# Patient Record
Sex: Female | Born: 1940
Health system: Southern US, Community
[De-identification: ages and names within clinical notes are randomized; demographics above are authoritative.]

## PROBLEM LIST (undated history)

## (undated) DIAGNOSIS — F419 Anxiety disorder, unspecified: Secondary | ICD-10-CM

## (undated) DIAGNOSIS — F32A Depression, unspecified: Secondary | ICD-10-CM

## (undated) DIAGNOSIS — K219 Gastro-esophageal reflux disease without esophagitis: Secondary | ICD-10-CM

## (undated) DIAGNOSIS — Z86010 Personal history of colonic polyps: Secondary | ICD-10-CM

## (undated) DIAGNOSIS — T7840XA Allergy, unspecified, initial encounter: Secondary | ICD-10-CM

## (undated) DIAGNOSIS — D649 Anemia, unspecified: Secondary | ICD-10-CM

## (undated) DIAGNOSIS — K2990 Gastroduodenitis, unspecified, without bleeding: Secondary | ICD-10-CM

## (undated) DIAGNOSIS — IMO0002 Reserved for concepts with insufficient information to code with codable children: Secondary | ICD-10-CM

## (undated) DIAGNOSIS — K589 Irritable bowel syndrome without diarrhea: Secondary | ICD-10-CM

## (undated) DIAGNOSIS — F329 Major depressive disorder, single episode, unspecified: Secondary | ICD-10-CM

## (undated) DIAGNOSIS — K2970 Gastritis, unspecified, without bleeding: Secondary | ICD-10-CM

## (undated) HISTORY — PX: TONSILLECTOMY: SUR1361

## (undated) HISTORY — DX: Anemia, unspecified: D64.9

## (undated) HISTORY — DX: Allergy, unspecified, initial encounter: T78.40XA

## (undated) HISTORY — DX: Gastroduodenitis, unspecified, without bleeding: K29.90

## (undated) HISTORY — DX: Major depressive disorder, single episode, unspecified: F32.9

## (undated) HISTORY — PX: CHOLECYSTECTOMY: SHX55

## (undated) HISTORY — PX: APPENDECTOMY: SHX54

## (undated) HISTORY — DX: Gastritis, unspecified, without bleeding: K29.70

## (undated) HISTORY — DX: Gastro-esophageal reflux disease without esophagitis: K21.9

## (undated) HISTORY — DX: Anxiety disorder, unspecified: F41.9

## (undated) HISTORY — DX: Irritable bowel syndrome, unspecified: K58.9

## (undated) HISTORY — PX: ABDOMINAL HYSTERECTOMY: SUR658

## (undated) HISTORY — DX: Depression, unspecified: F32.A

## (undated) HISTORY — DX: Reserved for concepts with insufficient information to code with codable children: IMO0002

## (undated) HISTORY — DX: Personal history of colonic polyps: Z86.010

---

## 2000-01-23 ENCOUNTER — Encounter: Payer: Self-pay | Admitting: Internal Medicine

## 2000-01-23 ENCOUNTER — Ambulatory Visit (HOSPITAL_COMMUNITY): Admission: RE | Admit: 2000-01-23 | Discharge: 2000-01-23 | Payer: Self-pay | Admitting: Internal Medicine

## 2001-01-15 ENCOUNTER — Ambulatory Visit (HOSPITAL_COMMUNITY): Admission: RE | Admit: 2001-01-15 | Discharge: 2001-01-15 | Payer: Self-pay | Admitting: Obstetrics and Gynecology

## 2001-01-15 ENCOUNTER — Encounter: Payer: Self-pay | Admitting: Obstetrics and Gynecology

## 2002-01-21 ENCOUNTER — Ambulatory Visit (HOSPITAL_COMMUNITY): Admission: RE | Admit: 2002-01-21 | Discharge: 2002-01-21 | Payer: Self-pay | Admitting: Obstetrics and Gynecology

## 2002-01-21 ENCOUNTER — Encounter: Payer: Self-pay | Admitting: Obstetrics and Gynecology

## 2002-01-21 ENCOUNTER — Other Ambulatory Visit: Admission: RE | Admit: 2002-01-21 | Discharge: 2002-01-21 | Payer: Self-pay | Admitting: Obstetrics and Gynecology

## 2003-03-31 ENCOUNTER — Encounter: Payer: Self-pay | Admitting: Family Medicine

## 2003-03-31 ENCOUNTER — Ambulatory Visit (HOSPITAL_COMMUNITY): Admission: RE | Admit: 2003-03-31 | Discharge: 2003-03-31 | Payer: Self-pay | Admitting: Obstetrics and Gynecology

## 2004-03-31 ENCOUNTER — Ambulatory Visit (HOSPITAL_COMMUNITY): Admission: RE | Admit: 2004-03-31 | Discharge: 2004-03-31 | Payer: Self-pay | Admitting: Family Medicine

## 2005-02-26 ENCOUNTER — Encounter: Admission: RE | Admit: 2005-02-26 | Discharge: 2005-02-26 | Payer: Self-pay | Admitting: Family Medicine

## 2006-03-29 ENCOUNTER — Encounter: Admission: RE | Admit: 2006-03-29 | Discharge: 2006-03-29 | Payer: Self-pay | Admitting: Pediatrics

## 2007-04-17 ENCOUNTER — Ambulatory Visit (HOSPITAL_COMMUNITY): Admission: RE | Admit: 2007-04-17 | Discharge: 2007-04-17 | Payer: Self-pay | Admitting: Family Medicine

## 2007-06-16 ENCOUNTER — Emergency Department (HOSPITAL_COMMUNITY): Admission: EM | Admit: 2007-06-16 | Discharge: 2007-06-16 | Payer: Self-pay | Admitting: Emergency Medicine

## 2007-09-04 DIAGNOSIS — Z860100 Personal history of colon polyps, unspecified: Secondary | ICD-10-CM

## 2007-09-04 HISTORY — DX: Personal history of colonic polyps: Z86.010

## 2007-09-04 HISTORY — DX: Personal history of colon polyps, unspecified: Z86.0100

## 2007-10-14 ENCOUNTER — Ambulatory Visit: Payer: Self-pay | Admitting: Gastroenterology

## 2007-10-14 LAB — CONVERTED CEMR LAB
ALT: 22 units/L (ref 0–35)
AST: 18 units/L (ref 0–37)
CO2: 30 meq/L (ref 19–32)
Calcium: 10.1 mg/dL (ref 8.4–10.5)
Creatinine, Ser: 1 mg/dL (ref 0.4–1.2)
Eosinophils Absolute: 0.2 10*3/uL (ref 0.0–0.6)
Eosinophils Relative: 1.8 % (ref 0.0–5.0)
Ferritin: 100.7 ng/mL (ref 10.0–291.0)
GFR calc Af Amer: 71 mL/min
Iron: 75 ug/dL (ref 42–145)
Monocytes Absolute: 1 10*3/uL — ABNORMAL HIGH (ref 0.2–0.7)
Neutro Abs: 7.8 10*3/uL — ABNORMAL HIGH (ref 1.4–7.7)
Neutrophils Relative %: 66.6 % (ref 43.0–77.0)
RDW: 13.6 % (ref 11.5–14.6)
Sodium: 137 meq/L (ref 135–145)
TSH: 0.95 microintl units/mL (ref 0.35–5.50)
Total Protein: 7.4 g/dL (ref 6.0–8.3)
Transferrin: 237 mg/dL (ref >=212.0)

## 2007-11-21 ENCOUNTER — Ambulatory Visit: Payer: Self-pay | Admitting: Gastroenterology

## 2007-11-21 ENCOUNTER — Encounter: Payer: Self-pay | Admitting: Gastroenterology

## 2007-11-21 DIAGNOSIS — K2990 Gastroduodenitis, unspecified, without bleeding: Secondary | ICD-10-CM

## 2007-11-21 DIAGNOSIS — K2970 Gastritis, unspecified, without bleeding: Secondary | ICD-10-CM | POA: Insufficient documentation

## 2007-12-26 ENCOUNTER — Ambulatory Visit: Payer: Self-pay | Admitting: Gastroenterology

## 2007-12-26 DIAGNOSIS — R1012 Left upper quadrant pain: Secondary | ICD-10-CM | POA: Insufficient documentation

## 2007-12-29 ENCOUNTER — Telehealth: Payer: Self-pay | Admitting: Gastroenterology

## 2008-01-05 ENCOUNTER — Telehealth: Payer: Self-pay | Admitting: Gastroenterology

## 2008-01-20 DIAGNOSIS — T7840XA Allergy, unspecified, initial encounter: Secondary | ICD-10-CM | POA: Insufficient documentation

## 2008-01-20 DIAGNOSIS — F329 Major depressive disorder, single episode, unspecified: Secondary | ICD-10-CM | POA: Insufficient documentation

## 2008-01-20 DIAGNOSIS — F4323 Adjustment disorder with mixed anxiety and depressed mood: Secondary | ICD-10-CM | POA: Insufficient documentation

## 2008-01-22 ENCOUNTER — Ambulatory Visit: Payer: Self-pay | Admitting: Gastroenterology

## 2008-01-22 DIAGNOSIS — K589 Irritable bowel syndrome without diarrhea: Secondary | ICD-10-CM | POA: Insufficient documentation

## 2008-01-22 DIAGNOSIS — B373 Candidiasis of vulva and vagina: Secondary | ICD-10-CM | POA: Insufficient documentation

## 2008-01-22 DIAGNOSIS — K219 Gastro-esophageal reflux disease without esophagitis: Secondary | ICD-10-CM | POA: Insufficient documentation

## 2008-01-27 ENCOUNTER — Telehealth: Payer: Self-pay | Admitting: Gastroenterology

## 2008-02-06 ENCOUNTER — Encounter (INDEPENDENT_AMBULATORY_CARE_PROVIDER_SITE_OTHER): Payer: Self-pay | Admitting: *Deleted

## 2008-02-24 ENCOUNTER — Ambulatory Visit: Payer: Self-pay | Admitting: Gastroenterology

## 2008-02-24 LAB — CONVERTED CEMR LAB
Fecal Occult Blood: NEGATIVE
OCCULT 1: NEGATIVE
OCCULT 5: NEGATIVE

## 2008-04-13 ENCOUNTER — Encounter: Payer: Self-pay | Admitting: Gastroenterology

## 2008-04-13 ENCOUNTER — Telehealth: Payer: Self-pay | Admitting: Gastroenterology

## 2008-04-23 ENCOUNTER — Ambulatory Visit: Payer: Self-pay | Admitting: Gastroenterology

## 2008-04-23 LAB — CONVERTED CEMR LAB
Basophils Absolute: 0.1 10*3/uL (ref 0.0–0.1)
Monocytes Absolute: 0.7 10*3/uL (ref 0.1–1.0)
Platelets: 377 10*3/uL (ref 150–400)
RBC: 4.55 M/uL (ref 3.87–5.11)
RDW: 13.1 % (ref 11.5–14.6)
Sed Rate: 10 mm/hr (ref 0–22)

## 2008-04-26 ENCOUNTER — Telehealth (INDEPENDENT_AMBULATORY_CARE_PROVIDER_SITE_OTHER): Payer: Self-pay

## 2008-04-26 ENCOUNTER — Telehealth: Payer: Self-pay | Admitting: Gastroenterology

## 2008-05-31 ENCOUNTER — Ambulatory Visit: Payer: Self-pay | Admitting: Gastroenterology

## 2008-06-03 ENCOUNTER — Encounter: Payer: Self-pay | Admitting: Gastroenterology

## 2008-06-07 ENCOUNTER — Telehealth: Payer: Self-pay | Admitting: Gastroenterology

## 2009-08-09 ENCOUNTER — Ambulatory Visit (HOSPITAL_COMMUNITY): Admission: RE | Admit: 2009-08-09 | Discharge: 2009-08-09 | Payer: Self-pay | Admitting: Obstetrics and Gynecology

## 2009-10-25 DIAGNOSIS — E663 Overweight: Secondary | ICD-10-CM | POA: Insufficient documentation

## 2009-11-02 DIAGNOSIS — B353 Tinea pedis: Secondary | ICD-10-CM | POA: Insufficient documentation

## 2009-12-26 DIAGNOSIS — J342 Deviated nasal septum: Secondary | ICD-10-CM | POA: Insufficient documentation

## 2010-09-24 ENCOUNTER — Encounter: Payer: Self-pay | Admitting: Family Medicine

## 2010-12-22 DIAGNOSIS — J309 Allergic rhinitis, unspecified: Secondary | ICD-10-CM | POA: Insufficient documentation

## 2011-01-16 NOTE — Assessment & Plan Note (Signed)
[facility]                         GASTROENTEROLOGY OFFICE NOTE   SINDHU, NGUYEN                        MRN:          [id number]  DATE:10/14/2007                            DOB:          06-Jun-1941    Stephanie Valenzuela is a 70 year old white female, self-referred today, for  evaluation of abdominal pain in the left upper quadrant and epigastric  area for the last ten days with some radiation of pain into her mid-  back.  This is more of a dyspeptic indigestion type pain, but she also  is having some lower abdominal gas, bloating, cramping, recurrent left  lower quadrant discomfort.  She has frequent bowel movements with gas  and bloating, but denies melena or hematochezia.  She tried some over-  the-counter Zantac with mild improvement in her reflux symptoms.  There  is no associated dysphagia or hepatobiliary complaints.  She  specifically denies clay-colored stools, dark urine, icterus, fever or  chills.  Her appetite is good and her weight is stable.   Patient had a cholecystectomy in the 1970s and had ERCP, what sounds  like a retained common bile duct stone in 1982.  These records are not  available for review.  She also has a history of previous depression,  allergic sinusitis.   She has been seen in the past by Dr. Corinda Gubler and had colonoscopy with  polypectomy in 2001.  Diverticulosis was also noted at that time.  Followup colonoscopy was with Dr. Tasia Catchings in 2005 and this was  apparently normal.  Again, I do not have this record for review.   She follows a fairly regular diet, denies any specific food intolerance.  Her appetite is good and she has had no weight-loss.  She denies the  abuse of alcohol, cigarettes or NSAIDs.   MEDICATIONS:  None.   She has a history of PENICILLIN and SULFA allergy.   FAMILY HISTORY:  Noncontributory.   SOCIAL HISTORY:  She is divorced and lives by herself.  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  She quit  smoking a few months ago, has never abused ethanol.   REVIEW OF SYSTEMS:  Remarkable for minor problems, such as night sweats,  back pain, headaches, insomnia, nonspecific peripheral edema.  She  denies any specific cardiopulmonary, genitourinary, neurologic,  orthopedic, endocrine or gynecologic problems.  She is status post  hysterectomy.   EXAM:  She is a healthy-appearing, white female, appearing younger than  her stated age.  She is 5 feet 6 and weighs 169 pounds.  Blood pressure is 100/64, and pulse was 80 and regular.  I could not appreciate stigmata of chronic liver disease.  CHEST:  Clear and she was in a regular rhythm without murmurs, gallops  or rubs.  I could not appreciate hepatosplenomegaly, abdominal masses or  significant tenderness.  There is a well-healed epigastric abdominal  scar.  Bowel sounds were normal.  Peripheral extremities were unremarkable.  MENTAL STATUS:  Clear.  Inspection of rectum was unremarkable, as was rectal exam.  There was  soft stool present that  was trace guaiac-positive.   ASSESSMENT:  1. Rather classic gastroesophageal reflux disease in a patient who is      status post cholecystectomy.  2. Irritable bowel syndrome in a patient with diverticulosis coli.  3. Guaiac-positive stool of unexplained etiology - rule out recurrent      colon polyps.  4. Status post cholecystectomy with removal of common bile duct stone.  5. History of chronic cigarette abuse over many years.   RECOMMENDATIONS:  1. Outpatient endoscopy and colonoscopy at her convenience.  2. Check screening laboratory parameters.  3. Reflux regimen explained to patient.  Will start Aciphex 20 mg      before breakfast and twice a day as needed.  4. High-fiber diet as tolerated.     Vania Rea. Jarold Motto, MD, Caleen Essex, FAGA  Electronically Signed    DRP/MedQ  DD: 10/14/2007  DT: 10/15/2007  Job #: [id number]   cc:   Arelia Sneddon Vincente Poli, M.D.

## 2011-06-14 LAB — CBC
HCT: 41.3
Hemoglobin: 14.4
MCHC: 34.9
MCV: 88.1
RBC: 4.68
RDW: 12.4
WBC: 13.9 — ABNORMAL HIGH

## 2011-06-14 LAB — BASIC METABOLIC PANEL
CO2: 24
Calcium: 8.9
Creatinine, Ser: 1.24 — ABNORMAL HIGH
GFR calc Af Amer: 52 — ABNORMAL LOW
GFR calc non Af Amer: 43 — ABNORMAL LOW
Glucose, Bld: 116 — ABNORMAL HIGH
Sodium: 132 — ABNORMAL LOW

## 2011-06-14 LAB — URINALYSIS, ROUTINE W REFLEX MICROSCOPIC
Ketones, ur: 15 — AB
Urobilinogen, UA: 1

## 2011-06-14 LAB — DIFFERENTIAL
Eosinophils Absolute: 0
Eosinophils Relative: 0
Monocytes Absolute: 1.6 — ABNORMAL HIGH
Neutrophils Relative %: 74

## 2011-06-14 LAB — URINE CULTURE

## 2011-08-22 DIAGNOSIS — R238 Other skin changes: Secondary | ICD-10-CM | POA: Insufficient documentation

## 2011-08-22 DIAGNOSIS — N390 Urinary tract infection, site not specified: Secondary | ICD-10-CM | POA: Insufficient documentation

## 2011-09-05 ENCOUNTER — Telehealth: Payer: Self-pay | Admitting: Gastroenterology

## 2011-09-05 NOTE — Telephone Encounter (Signed)
Pt with hx of bleeding ulcers. Last OV 04/23/2008 for recurrent H. Pylori; repeat Breath Test was neg. Hx Depression, gastritis, Abd. Pain, cholecystectomy in 1970. Pt reports a lot of pain like before when she had an ulcer. The pain is in her left rib area that radiates to her back. She went to her PCP with no help. She takes no meds, her stools are regular, no blood in her stools, but she is running a low grade temp. Pt was given an appt on 09/11/11 at 2pm.

## 2011-09-07 ENCOUNTER — Encounter: Payer: Self-pay | Admitting: *Deleted

## 2011-09-11 ENCOUNTER — Other Ambulatory Visit (INDEPENDENT_AMBULATORY_CARE_PROVIDER_SITE_OTHER): Payer: Medicare Other

## 2011-09-11 ENCOUNTER — Encounter: Payer: Self-pay | Admitting: Gastroenterology

## 2011-09-11 ENCOUNTER — Ambulatory Visit (INDEPENDENT_AMBULATORY_CARE_PROVIDER_SITE_OTHER): Payer: Medicare Other | Admitting: Gastroenterology

## 2011-09-11 DIAGNOSIS — R1084 Generalized abdominal pain: Secondary | ICD-10-CM

## 2011-09-11 DIAGNOSIS — Z9049 Acquired absence of other specified parts of digestive tract: Secondary | ICD-10-CM

## 2011-09-11 DIAGNOSIS — R109 Unspecified abdominal pain: Secondary | ICD-10-CM | POA: Insufficient documentation

## 2011-09-11 DIAGNOSIS — Z79899 Other long term (current) drug therapy: Secondary | ICD-10-CM

## 2011-09-11 DIAGNOSIS — Z9089 Acquired absence of other organs: Secondary | ICD-10-CM

## 2011-09-11 LAB — CBC WITH DIFFERENTIAL/PLATELET
Basophils Absolute: 0.1 10*3/uL (ref 0.0–0.1)
Basophils Relative: 1.2 % (ref 0.0–3.0)
Eosinophils Absolute: 0.2 10*3/uL (ref 0.0–0.7)
Eosinophils Relative: 2.2 % (ref 0.0–5.0)
HCT: 44.8 % (ref 36.0–46.0)
Lymphocytes Relative: 28.2 % (ref 12.0–46.0)
MCV: 90.9 fl (ref 78.0–100.0)
Monocytes Absolute: 0.8 10*3/uL (ref 0.1–1.0)
Monocytes Relative: 8.7 % (ref 3.0–12.0)
WBC: 9.3 10*3/uL (ref 4.5–10.5)

## 2011-09-11 LAB — BASIC METABOLIC PANEL
BUN: 14 mg/dL (ref 6–23)
CO2: 30 mEq/L (ref 19–32)
GFR: 63.26 mL/min (ref >60.00)

## 2011-09-11 LAB — HEPATIC FUNCTION PANEL
ALT: 22 U/L (ref 0–35)
AST: 18 U/L (ref 0–37)
Albumin: 4.1 g/dL (ref 3.5–5.2)
Alkaline Phosphatase: 110 U/L (ref 39–117)
Total Bilirubin: 0.8 mg/dL (ref 0.3–1.2)
Total Protein: 7.5 g/dL (ref 6.0–8.3)

## 2011-09-11 LAB — IBC PANEL: Saturation Ratios: 37.5 % (ref 20.0–50.0)

## 2011-09-11 LAB — SEDIMENTATION RATE: Sed Rate: 13 mm/hr (ref 0–22)

## 2011-09-11 LAB — VITAMIN B12: Vitamin B-12: 355 pg/mL (ref 211–911)

## 2011-09-11 LAB — FOLATE: Folate: 21.8 ng/mL (ref >5.9)

## 2011-09-11 LAB — TSH: TSH: 0.76 u[IU]/mL (ref 0.35–5.50)

## 2011-09-11 NOTE — Progress Notes (Signed)
History of Present Illness:  This is a 71 year old Caucasian female status post cholecystectomy who has had years of recurrent epigastric and left lower quadrant abdominal pain of unexplained etiology. In 2009 she had a negative colonoscopy, and endoscopy showed evidence of H. pylori gastritis. She was treated with triple drug therapy, and had subsequent hydrogen breath testing which confirmed H. pylori eradication. Currently she describes a dull aching sensation in her epigastric and left upper quadrant area radiating into her back and into her left lower quadrant. This has not been aided by Prilosec therapy, and has no relationship to meals or bowel movements. It is a dull aching pain that does not keep her awake at night, and there are no associated hepatobiliary or systemic complaints. She denies abuse of alcohol, cigarettes, or NSAIDs. Also there is no history of specific food intolerances. She does have mild nausea and occasional acid reflux symptomatology, but no dysphagia.  I have reviewed this patient's present history, medical and surgical past history, allergies and medications.     ROS: The remainder of the 10 point ROS is negative.. positive for chronic back pain, allergic sinusitis, chronic cough and fatigue, chronic depression, headaches, night sweats, and recurrent sore throats. She also has had previous hysterectomy.     Physical Exam: General well developed well nourished patient in no acute distress, appearing her stated age Eyes PERRLA, no icterus, fundoscopic exam per opthamologist Skin no lesions noted Neck supple, no adenopathy, no thyroid enlargement, no tenderness Chest clear to percussion and auscultation Heart no significant murmurs, gallops or rubs noted Abdomen no hepatosplenomegaly masses or tenderness, BS normal.  Rectal inspection normal no fissures, or fistulae noted.  No masses or tenderness on digital exam. Stool guaiac negative. Noted tenderness to deep  palpation the left lower quadrant without rebound. Extremities no acute joint lesions, edema, phlebitis or evidence of cellulitis. Neurologic patient oriented x 3, cranial nerves intact, no focal neurologic deficits noted. Psychological mental status normal and normal affect.  Assessment and plan: Unusual abdominal pain which seems to involve her entire left abdominal area. This been present for over 4 years without a definitive diagnosis. I have drawn screening laboratory parameters and we'll proceed with CT scan of the abdomen and pelvis. She may need followup endoscopy and colonoscopy depending on these results. I have asked her to avoid NSAIDs and to use when necessary Tylenol for pain.  Encounter Diagnosis  Name Primary?  . Abdominal pain Yes

## 2011-09-11 NOTE — Patient Instructions (Signed)
Please go to the basement today for your labs.    You have been scheduled for a CT scan of the abdomen and pelvis at [facility] ([address]---this is in the same building as Architectural technologist).   You are scheduled on 09/14/2011 at 9:00am. You should arrive 15 minutes prior to your appointment time for registration. Please follow the written instructions below on the day of your exam:  WARNING: IF YOU ARE ALLERGIC TO IODINE/X-RAY DYE, PLEASE NOTIFY RADIOLOGY IMMEDIATELY AT [phone]! YOU WILL BE GIVEN A 13 HOUR PREMEDICATION PREP.  1) Do not eat or drink anything after 5:00am (4 hours prior to your test) 2) You have been given 2 bottles of oral contrast to drink. The solution may taste better if refrigerated, but do NOT add ice or any other liquid to this solution. Shake  well before drinking.    Drink 1 bottle of contrast @ 7:00am (2 hours prior to your exam)  Drink 1 bottle of contrast @ 8:00am (1 hour prior to your exam)  You may take any medications as prescribed with a small amount of water except for the following: Metformin, Glucophage, Glucovance, Avandamet, Riomet, Fortamet, Actoplus Met, Janumet, Glumetza or Metaglip. The above medications must be held the day of the exam AND 48 hours after the exam.  The purpose of you drinking the oral contrast is to aid in the visualization of your intestinal tract. The contrast solution may cause some diarrhea. Before your exam is started, you will be given a small amount of fluid to drink. Depending on your individual set of symptoms, you may also receive an intravenous injection of x-ray contrast/dye. Plan on being at [facility] for 30 minutes or long, depending on the type of exam you are having performed.  If you have any questions regarding your exam or if you need to reschedule, you may call the CT department at [phone] between the hours of 8:00 am and 5:00 pm,  Monday-Friday.  ________________________________________________________________________

## 2011-09-14 ENCOUNTER — Telehealth: Payer: Self-pay | Admitting: *Deleted

## 2011-09-14 ENCOUNTER — Ambulatory Visit (INDEPENDENT_AMBULATORY_CARE_PROVIDER_SITE_OTHER)
Admission: RE | Admit: 2011-09-14 | Discharge: 2011-09-14 | Disposition: A | Discharge status: Home / Self Care | Payer: Medicare Other | Source: Ambulatory Visit | Attending: Gastroenterology | Admitting: Gastroenterology

## 2011-09-14 ENCOUNTER — Other Ambulatory Visit: Payer: Self-pay | Admitting: Gastroenterology

## 2011-09-14 DIAGNOSIS — R109 Unspecified abdominal pain: Secondary | ICD-10-CM

## 2011-09-14 DIAGNOSIS — R110 Nausea: Secondary | ICD-10-CM

## 2011-09-14 DIAGNOSIS — IMO0001 Reserved for inherently not codable concepts without codable children: Secondary | ICD-10-CM

## 2011-09-14 IMAGING — CT CT ABD-PELV W/ CM
2 of 5 series · 17 of 46 positions shown, 19 images · IV contrast (omnipaque)
Comparison: CT scan [DATE].

CLINICAL DATA: Left-sided abdominal pain and nausea.

CT ABDOMEN AND PELVIS WITH CONTRAST
TECHNIQUE: Multidetector CT imaging of the abdomen and pelvis was
performed following the standard protocol during bolus
administration of intravenous contrast.
Contrast: 100mL OMNIPAQUE IOHEXOL 300 MG/ML IV SOLN

[Series 2: abd/ pel 5mm · axial · 0.70mm/px · z∈[-449,-24]mm · 14 of 95 slices shown, 16 images]
[im 5/95  soft-tissue]
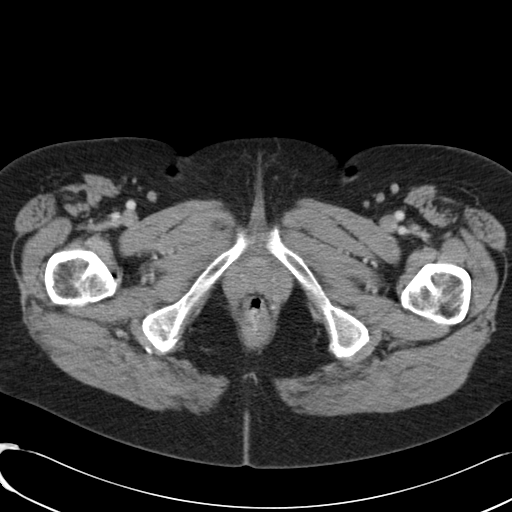
[im 5/95  bone]
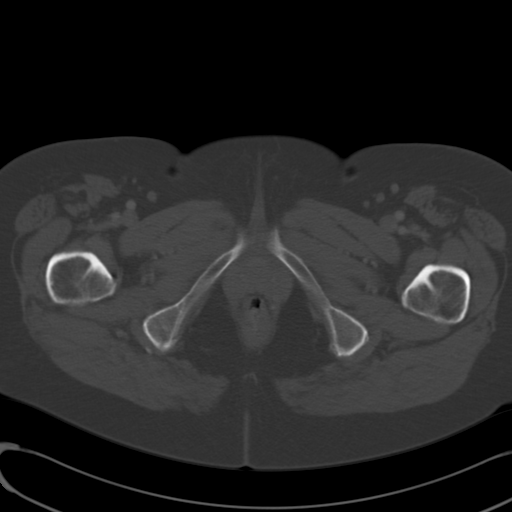
[im 15/95  soft-tissue]
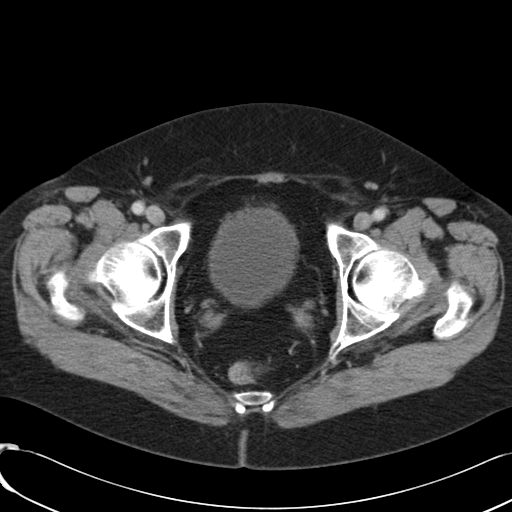
[im 19/95  soft-tissue]
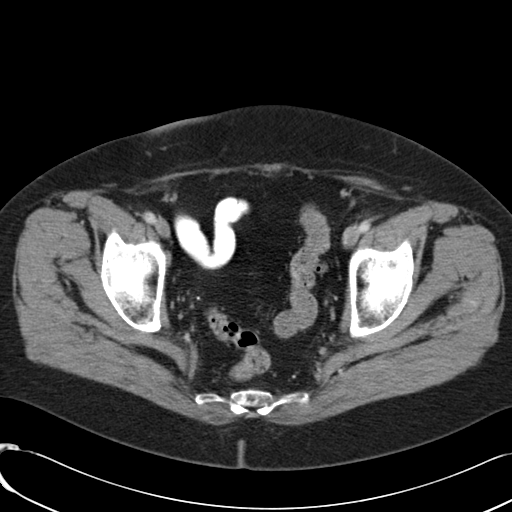
[im 24/95  soft-tissue]
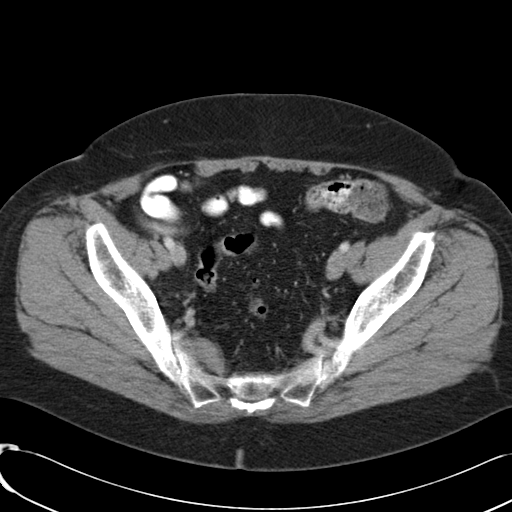
[im 33/95  soft-tissue]
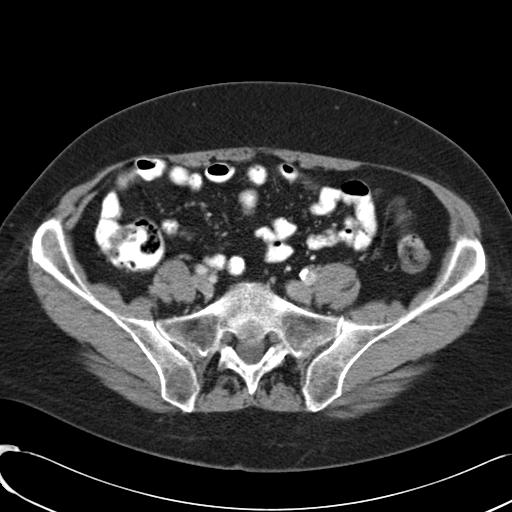
[im 38/95  soft-tissue]
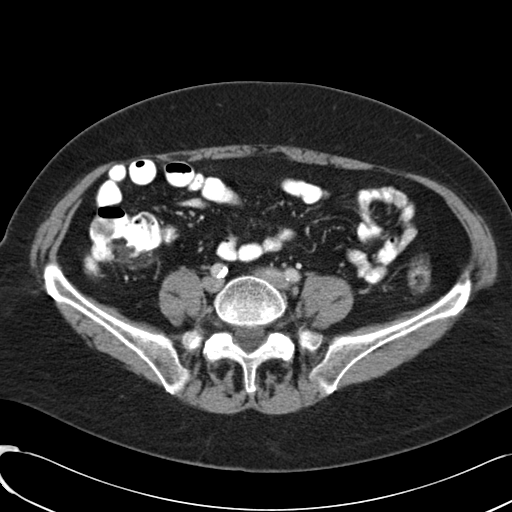
[im 43/95  soft-tissue]
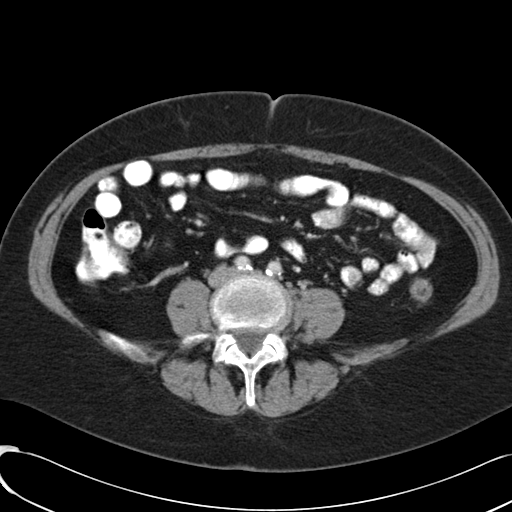
[im 52/95  soft-tissue]
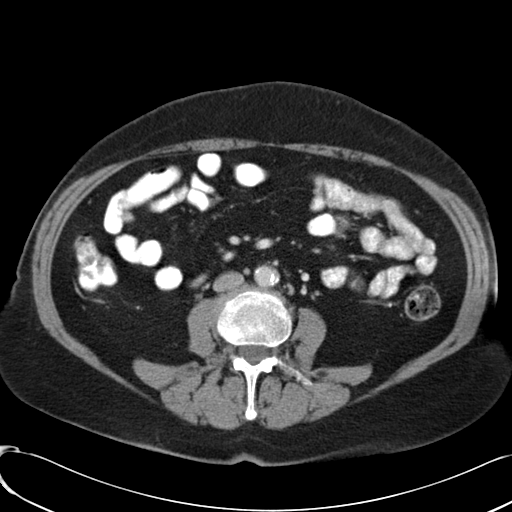
[im 57/95  soft-tissue]
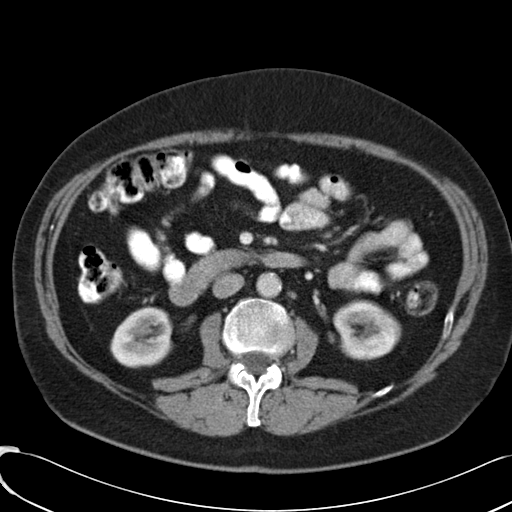
[im 57/95  bone]
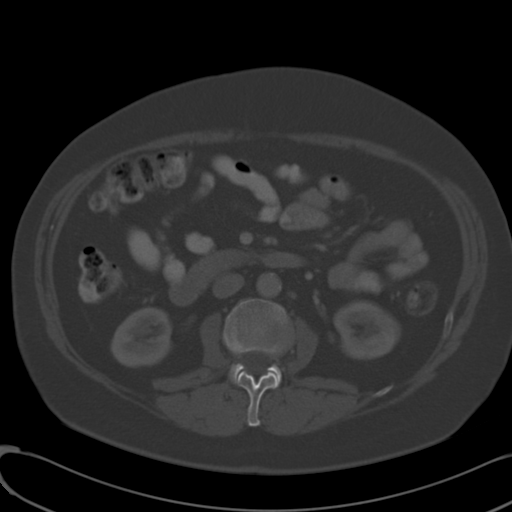
[im 62/95  soft-tissue]
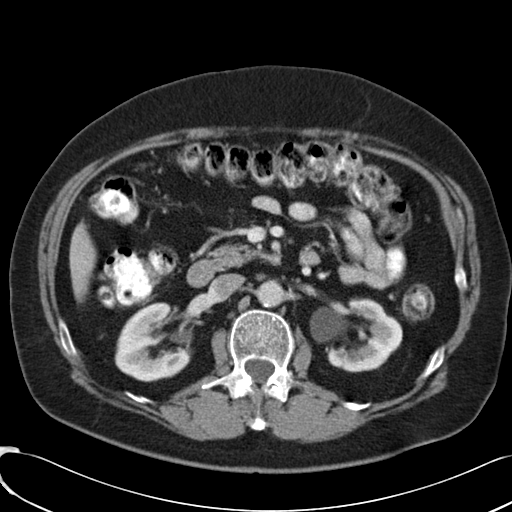
[im 71/95  soft-tissue]
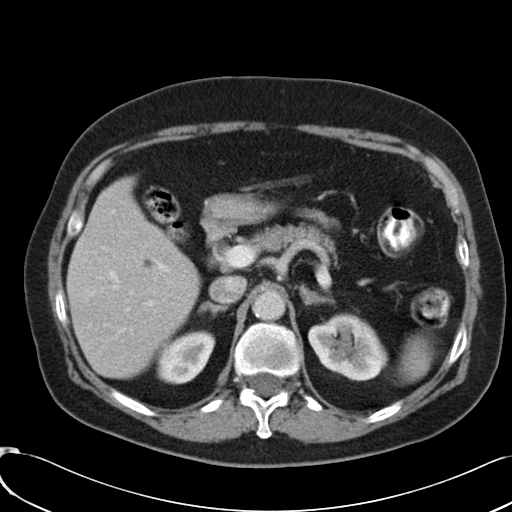
[im 76/95  soft-tissue]
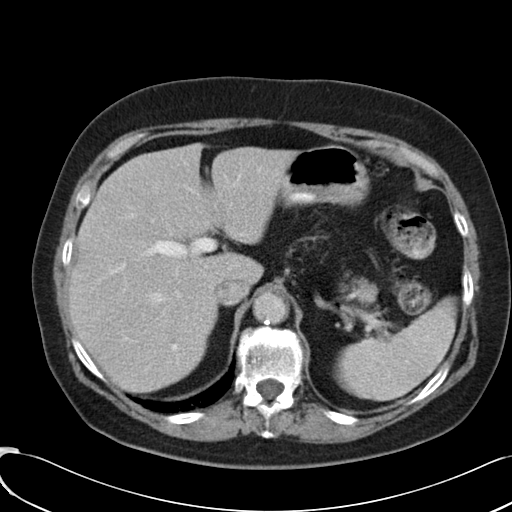
[im 80/95  soft-tissue]
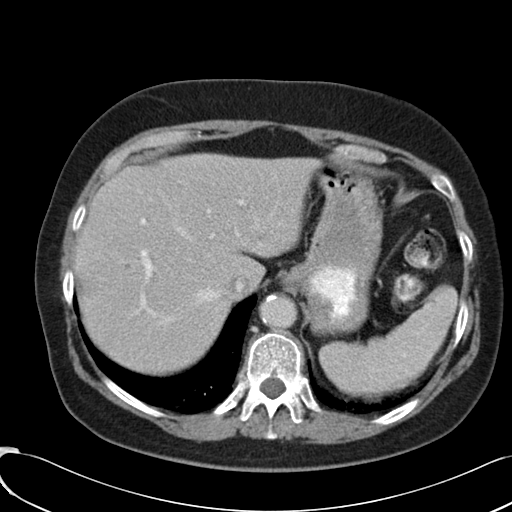
[im 90/95  soft-tissue]
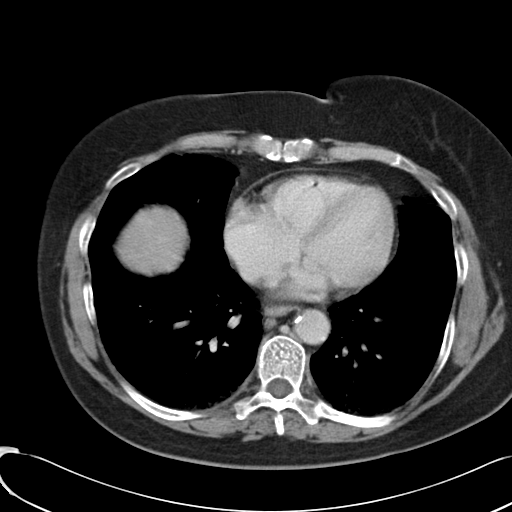

[Series 602: cor · coronal · 0.95mm/px · 3 of 102 slices shown]
[im 34/102  soft-tissue]
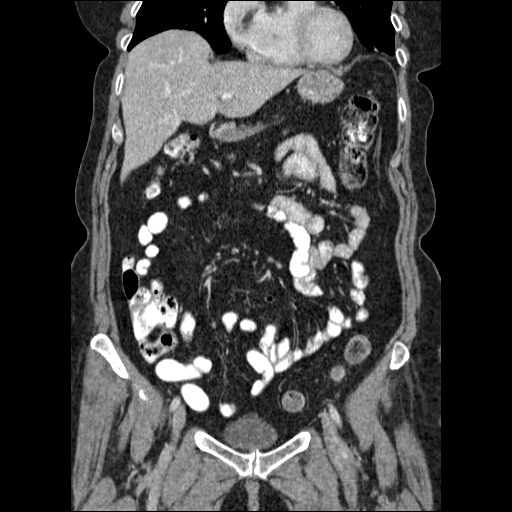
[im 45/102  soft-tissue]
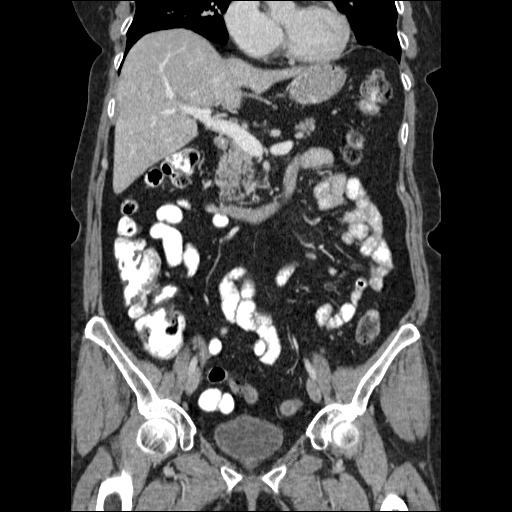
[im 57/102  soft-tissue]
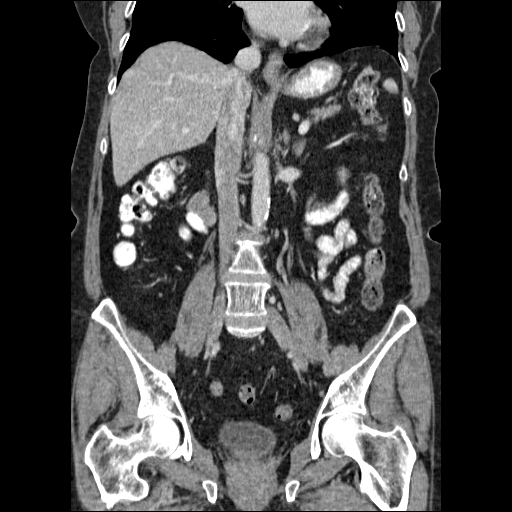

[17 of 46 positions shown; findings below may reference images not displayed]

FINDINGS: The lung bases demonstrate emphysematous changes and
interstitial scarring.  No pleural effusion or worrisome pulmonary
nodule.  The heart is normal in size.  No pericardial effusion.

The liver is unremarkable except for small stable low attenuation
lesions which are likely benign cysts.  No worrisome hepatic
lesions or intrahepatic biliary dilatation.  The gallbladder is
surgically absent.  No common bile duct dilatation.  The pancreas
is normal.  The spleen is normal in size.  No focal lesions.  The
adrenal glands and kidneys are normal. Chronic scarring changes
involving both kidneys.

The stomach, duodenum, small bowel and colon are unremarkable.
Scattered sigmoid diverticulosis is noted.  No findings for acute
diverticulitis.  No mass lesions or inflammatory changes.  No
mesenteric or retroperitoneal masses or lymphadenopathy.  Small
scattered nodes are noted.  The aorta demonstrates moderate
atherosclerotic changes but no focal aneurysm or dissection.

The uterus is surgically absent.  The bladder is normal.  No pelvic
mass, adenopathy or free pelvic fluid collections.  No inguinal
mass or hernia.  The bony structures are unremarkable.
IMPRESSION: 1.  No acute abdominal/pelvic findings, mass lesions or adenopathy.
2.  Stable atherosclerotic changes involving the aorta and iliac
vessels.
3.  Stable small low attenuation liver lesions consistent with
benign cysts.

## 2011-09-14 MED ORDER — IOHEXOL 300 MG/ML  SOLN
100.0000 mL | Freq: Once | INTRAMUSCULAR | Status: AC | PRN
  Administered 2011-09-14: 100 mL via INTRAVENOUS

## 2011-09-14 NOTE — Telephone Encounter (Signed)
Message copied by Florene Glen on Fri Sep 14, 2011  2:25 PM ------      Message from: [place], [place] R      Created: Fri Sep 14, 2011 11:55 AM       Probable adhesions. Her CT scan was unremarkable. After reviewing her records she is due for followup endoscopy and colonoscopy which should be scheduled at her convenience.

## 2011-09-14 NOTE — Telephone Encounter (Signed)
Informed pt of Dr Norval Gable findings/recommendations and she stated understanding. Pt has Pre Visit on 10/05/11 at 11am and ECL on 10/12/11 at 11am.

## 2011-10-05 ENCOUNTER — Ambulatory Visit (AMBULATORY_SURGERY_CENTER): Payer: Medicare Other | Admitting: *Deleted

## 2011-10-05 ENCOUNTER — Encounter: Payer: Self-pay | Admitting: Gastroenterology

## 2011-10-05 VITALS — Ht 66.0 in | Wt 193.0 lb

## 2011-10-05 DIAGNOSIS — R109 Unspecified abdominal pain: Secondary | ICD-10-CM

## 2011-10-05 MED ORDER — PEG-KCL-NACL-NASULF-NA ASC-C 100 G PO SOLR: ORAL | Status: DC

## 2011-10-12 ENCOUNTER — Encounter: Payer: Self-pay | Admitting: Gastroenterology

## 2011-10-12 ENCOUNTER — Ambulatory Visit (AMBULATORY_SURGERY_CENTER): Payer: Medicare Other | Admitting: Gastroenterology

## 2011-10-12 ENCOUNTER — Encounter: Payer: Medicare Other | Admitting: Gastroenterology

## 2011-10-12 VITALS — BP 116/72 | HR 79 | Temp 98.9°F | Resp 22 | Ht 66.0 in | Wt 193.0 lb

## 2011-10-12 DIAGNOSIS — K589 Irritable bowel syndrome without diarrhea: Secondary | ICD-10-CM

## 2011-10-12 DIAGNOSIS — R109 Unspecified abdominal pain: Secondary | ICD-10-CM

## 2011-10-12 DIAGNOSIS — R1012 Left upper quadrant pain: Secondary | ICD-10-CM

## 2011-10-12 DIAGNOSIS — Z1211 Encounter for screening for malignant neoplasm of colon: Secondary | ICD-10-CM

## 2011-10-12 DIAGNOSIS — Z1210 Encounter for screening for malignant neoplasm of intestinal tract, unspecified: Secondary | ICD-10-CM

## 2011-10-12 MED ORDER — SODIUM CHLORIDE 0.9 % IV SOLN: 500.0000 mL | INTRAVENOUS | Status: DC

## 2011-10-12 NOTE — Progress Notes (Signed)
Addended by: Joen Laura on: 10/12/2011 12:04 PM   Modules accepted: Orders

## 2011-10-12 NOTE — Progress Notes (Signed)
Addended by: Gillermina Hu on: 10/12/2011 11:38 AM   Modules accepted: Orders

## 2011-10-12 NOTE — Progress Notes (Signed)
Patient did not experience any of the following events: a burn prior to discharge; a fall within the facility; wrong site/side/patient/procedure/implant event; or a hospital transfer or hospital admission upon discharge from the facility. (G8907) Patient did not have preoperative order for IV antibiotic SSI prophylaxis. (G8918)  

## 2011-10-12 NOTE — Op Note (Addendum)
[facility] [address] [place], [place]  [address]  ENDOSCOPY PROCEDURE REPORT  PATIENT:  Stephanie Valenzuela, Stephanie Valenzuela  MR#:  [id number] BIRTHDATE:  June 16, 1941, 70 yrs. old  GENDER:  female  ENDOSCOPIST:  Vania Rea. Jarold Motto, MD, [facility] Referred by:  PROCEDURE DATE:  10/12/2011 PROCEDURE:  EGD with biopsy for H. pylori 19147 ASA CLASS:  Class II INDICATIONS:  abdominal pain PRIOR RX, FOR H.PYLORI INFECTION.  MEDICATIONS:   There was residual sedation effect present from prior procedure., propofol (Diprivan) 100 mg IV TOPICAL ANESTHETIC:  DESCRIPTION OF PROCEDURE:   After the risks and benefits of the procedure were explained, informed consent was obtained.  The LB [id number] endoscope was introduced through the mouth and advanced to the second portion of the duodenum.  The instrument was slowly withdrawn as the mucosa was fully examined. <<PROCEDUREIMAGES>>  Mild gastritis was found in the antrum. CLO BX. DONE. Retroflexed views revealed no abnormalities.    The scope was then withdrawn from the patient and the procedure completed.  COMPLICATIONS:  None  ENDOSCOPIC IMPRESSION: 1) Mild gastritis in the antrum CHRONIC LUQ PAIN AND PRIOR RX. FOR H.PYLORI RECOMMENDATIONS: 1) Await biopsy results 2) Rx CLO if positive 3) continue current meds  ______________________________ Vania Rea. Jarold Motto, MD, Clementeen Graham  CC:  Maryelizabeth Rowan, MD  n. REVISED:  10/12/2011 02:01 PM eSIGNED:   Vania Rea. Stellar Gensel at 10/12/2011 02:01 PM  Nilda Simmer, [phone]

## 2011-10-12 NOTE — Op Note (Addendum)
[facility] [address] [place], [place]  [address]  COLONOSCOPY PROCEDURE REPORT  PATIENT:  Stephanie, Valenzuela  MR#:  [id number] BIRTHDATE:  1941/01/13, 70 yrs. old  GENDER:  female ENDOSCOPIST:  Vania Rea. Jarold Motto, MD, [facility] REF. BY: PROCEDURE DATE:  10/12/2011 PROCEDURE:  Average-risk screening colonoscopy G0121 ASA CLASS:  Class II INDICATIONS:  Abdominal pain, Routine Risk Screening MEDICATIONS:   propofol (Diprivan) 200 mg IV  DESCRIPTION OF PROCEDURE:   After the risks and benefits and of the procedure were explained, informed consent was obtained. Digital rectal exam was performed and revealed no abnormalities. The [id number] endoscope was introduced through the anus and advanced to the cecum, which was identified by both the appendix and ileocecal valve.  The quality of the prep was .  The instrument was then slowly withdrawn as the colon was fully examined. <<PROCEDUREIMAGES>>  FINDINGS:  No polyps or cancers were seen.  This was otherwise a normal examination of the colon. LONG AND REDUNDANT COLON AND LARGE SIGMOID LOOP NOTED.   Retroflexed views in the rectum revealed no abnormalities.    The scope was then withdrawn from the patient and the procedure completed.  COMPLICATIONS:  None ENDOSCOPIC IMPRESSION: 1) No polyps or cancers 2) Otherwise normal examination LUQ PAIN FROM REDUNDANT AND TORTUOUS COLON. RECOMMENDATIONS: 1) Continue current medications 2) High fiber diet with liberal fluid intake. 3) Repeat colonoscopy 10 years. 4) metamucil or benefiber  REPEAT EXAM:  No  ______________________________ Vania Rea. Jarold Motto, MD, [facility] Graham  CC:  Maryelizabeth Rowan, MD  n. REVISED:  10/12/2011 02:02 PM eSIGNED:   Vania Rea. Orvile Corona at 10/12/2011 02:02 PM  Nilda Simmer, [phone]

## 2011-10-12 NOTE — Patient Instructions (Signed)
Discharge instructions given with verbal understanding. Handout on gastritis given. Resume previous medications. 

## 2011-10-12 NOTE — Progress Notes (Signed)
Propofol administered per l beeson crna per protocol. See scanned intra procedure report. ewm

## 2011-10-15 ENCOUNTER — Telehealth: Payer: Self-pay | Admitting: *Deleted

## 2011-10-15 ENCOUNTER — Encounter: Payer: Self-pay | Admitting: Gastroenterology

## 2011-10-15 NOTE — Telephone Encounter (Signed)
  Follow up Call-  Call back number 10/12/2011  Post procedure Call Back phone  # [phone]  Permission to leave phone message Yes     Patient questions:  Do you have a fever, pain , or abdominal swelling? no Pain Score  0 *  Have you tolerated food without any problems? yes  Have you been able to return to your normal activities? yes  Do you have any questions about your discharge instructions: Diet   no Medications  no Follow up visit  no  Do you have questions or concerns about your Care? no  Actions: * If pain score is 4 or above: No action needed, pain <4.

## 2012-06-05 ENCOUNTER — Other Ambulatory Visit: Payer: Self-pay | Admitting: Obstetrics and Gynecology

## 2012-08-12 ENCOUNTER — Encounter: Payer: Self-pay | Admitting: Gastroenterology

## 2012-08-12 ENCOUNTER — Ambulatory Visit (INDEPENDENT_AMBULATORY_CARE_PROVIDER_SITE_OTHER): Payer: Medicare Other | Admitting: Gastroenterology

## 2012-08-12 ENCOUNTER — Other Ambulatory Visit (INDEPENDENT_AMBULATORY_CARE_PROVIDER_SITE_OTHER): Payer: Medicare Other

## 2012-08-12 VITALS — BP 126/84 | HR 76 | Ht 66.0 in | Wt 198.4 lb

## 2012-08-12 DIAGNOSIS — K7689 Other specified diseases of liver: Secondary | ICD-10-CM

## 2012-08-12 DIAGNOSIS — Z9089 Acquired absence of other organs: Secondary | ICD-10-CM

## 2012-08-12 DIAGNOSIS — R079 Chest pain, unspecified: Secondary | ICD-10-CM

## 2012-08-12 DIAGNOSIS — K219 Gastro-esophageal reflux disease without esophagitis: Secondary | ICD-10-CM

## 2012-08-12 DIAGNOSIS — Z9049 Acquired absence of other specified parts of digestive tract: Secondary | ICD-10-CM

## 2012-08-12 DIAGNOSIS — R1314 Dysphagia, pharyngoesophageal phase: Secondary | ICD-10-CM

## 2012-08-12 DIAGNOSIS — R1319 Other dysphagia: Secondary | ICD-10-CM

## 2012-08-12 DIAGNOSIS — R1310 Dysphagia, unspecified: Secondary | ICD-10-CM

## 2012-08-12 LAB — FERRITIN: Ferritin: 88.8 ng/mL (ref 10.0–291.0)

## 2012-08-12 LAB — BASIC METABOLIC PANEL
BUN: 14 mg/dL (ref 6–23)
GFR: 59.39 mL/min — ABNORMAL LOW (ref >60.00)
Glucose, Bld: 102 mg/dL — ABNORMAL HIGH (ref 70–99)
Potassium: 4 mEq/L (ref 3.5–5.1)

## 2012-08-12 LAB — HEPATIC FUNCTION PANEL
ALT: 21 U/L (ref 0–35)
AST: 20 U/L (ref 0–37)
Albumin: 4 g/dL (ref 3.5–5.2)
Total Protein: 7.8 g/dL (ref 6.0–8.3)

## 2012-08-12 LAB — TSH: TSH: 0.51 u[IU]/mL (ref 0.35–5.50)

## 2012-08-12 LAB — CBC WITH DIFFERENTIAL/PLATELET
Eosinophils Relative: 2.2 % (ref 0.0–5.0)
HCT: 46.1 % — ABNORMAL HIGH (ref 36.0–46.0)
Lymphocytes Relative: 28.4 % (ref 12.0–46.0)
Monocytes Relative: 5.4 % (ref 3.0–12.0)
Neutrophils Relative %: 63.7 % (ref 43.0–77.0)
Platelets: 419 10*3/uL — ABNORMAL HIGH (ref 150.0–400.0)
WBC: 10.2 10*3/uL (ref 4.5–10.5)

## 2012-08-12 LAB — C-REACTIVE PROTEIN: CRP: 0.7 mg/dL (ref 0.5–20.0)

## 2012-08-12 LAB — VITAMIN B12: Vitamin B-12: 354 pg/mL (ref 211–911)

## 2012-08-12 LAB — SEDIMENTATION RATE: Sed Rate: 13 mm/hr (ref 0–22)

## 2012-08-12 MED ORDER — SUCRALFATE 1 GM/10ML PO SUSP: 1.0000 g | Freq: Four times a day (QID) | ORAL | Status: DC

## 2012-08-12 NOTE — Patient Instructions (Addendum)
Please stop Prilosec and start taking Nexium one capsule by mouth thirty minutes before breakfast daily.  Your physician has requested that you go to the basement for lab work before leaving today.  You have been scheduled for an endoscopy with propofol. Please follow written instructions given to you at your visit today. If you use inhalers (even only as needed) or a CPAP machine, please bring them with you on the day of your procedure.  We have sent the following medications to your pharmacy for you to pick up at your convenience:Carafate and Nexium.  _____________________________________________________________________________________________________  Stephanie Valenzuela have been scheduled for an esophageal manometry at [facility] on 10-06-2012 at 11:30 AM. Please arrive 30 minutes prior to your procedure for registration. You will need to go to outpatient registration (1st floor of the hospital) first. Make certain to bring your insurance cards as well as a complete list of medications.  Please remember the following:  1) Nothing to eat or drink after 12:00 midnight on the night before your test.  2) Hold all diabetic medications/insulin the morning of the test. You may eat and take your medications after the test.  3) For 3 days prior to your test do not take: Dexilant, Prevacid, Nexium, Protonix, Aciphex, Zegerid, Pantoprazole, Prilosec or omeprazole.  4) For 2 days prior to your test, do not take: Reglan, Tagamet, Zantac, Axid or Pepcid.  5) You MAY use an antacid such as Rolaids or Tums up to 12 hours prior to your test.  It will take at least 2 weeks to receive the results of this test from your physician. ------------------------------------------ ABOUT ESOPHAGEAL MANOMETRY Esophageal manometry (muh-NOM-uh-tree) is a test that gauges how well your esophagus works. Your esophagus is the long, muscular tube that connects your throat to your stomach. Esophageal manometry measures the  rhythmic muscle contractions (peristalsis) that occur in your esophagus when you swallow. Esophageal manometry also measures the coordination and force exerted by the muscles of your esophagus.  During esophageal manometry, a thin, flexible tube (catheter) that contains sensors is passed through your nose, down your esophagus and into your stomach. Esophageal manometry can be helpful in diagnosing some mostly uncommon disorders that affect your esophagus.  Why it's done Esophageal manometry is used to evaluate the movement (motility) of food through the esophagus and into the stomach. The test measures how well the circular bands of muscle (sphincters) at the top and bottom of your esophagus open and close, as well as the pressure, strength and pattern of the wave of esophageal muscle contractions that moves food along.  What you can expect Esophageal manometry is an outpatient procedure done without sedation. Most people tolerate it well. You may be asked to change into a hospital gown before the test starts.  During esophageal manometry  While you are sitting up, a member of your health care team sprays your throat with a numbing medication or puts numbing gel in your nose or both.  A catheter is guided through your nose into your esophagus. The catheter may be sheathed in a water-filled sleeve. It doesn't interfere with your breathing. However, your eyes may water, and you may gag. You may have a slight nosebleed from irritation.  After the catheter is in place, you may be asked to lie on your back on an exam table, or you may be asked to remain seated.  You then swallow small sips of water. As you do, a computer connected to the catheter records the pressure, strength and  pattern of your esophageal muscle contractions.  During the test, you'll be asked to breathe slowly and smoothly, remain as still as possible, and swallow only when you're asked to do so.  A member of your health care team may move  the catheter down into your stomach while the catheter continues its measurements.  The catheter then is slowly withdrawn. The test usually lasts 20 to 30 minutes.  After esophageal manometry  When your esophageal manometry is complete, you may return to your normal activities  This test typically takes 30-45 minutes to complete. ___________________________________________________________________________________________________________________  High-Fiber Diet Fiber is found in fruits, vegetables, and grains. A high-fiber diet encourages the addition of more whole grains, legumes, fruits, and vegetables in your diet. The recommended amount of fiber for adult males is 38 g per day. For adult females, it is 25 g per day. Pregnant and lactating women should get 28 g of fiber per day. If you have a digestive or bowel problem, ask your caregiver for advice before adding high-fiber foods to your diet. Eat a variety of high-fiber foods instead of only a select few type of foods.  PURPOSE  To increase stool bulk.  To make bowel movements more regular to prevent constipation.  To lower cholesterol.  To prevent overeating. WHEN IS THIS DIET USED?  It may be used if you have constipation and hemorrhoids.  It may be used if you have uncomplicated diverticulosis (intestine condition) and irritable bowel syndrome.  It may be used if you need help with weight management.  It may be used if you want to add it to your diet as a protective measure against atherosclerosis, diabetes, and cancer. SOURCES OF FIBER  Whole-grain breads and cereals.  Fruits, such as apples, oranges, bananas, berries, prunes, and pears.  Vegetables, such as green peas, carrots, sweet potatoes, beets, broccoli, cabbage, spinach, and artichokes.  Legumes, such split peas, soy, lentils.  Almonds. FIBER CONTENT IN FOODS Starches and Grains / Dietary Fiber (g)  Cheerios, 1 cup / 3 g  Corn Flakes cereal, 1 cup / 0.7  g  Rice crispy treat cereal, 1 cup / 0.3 g  Instant oatmeal (cooked),  cup / 2 g  Frosted wheat cereal, 1 cup / 5.1 g  Brown, long-grain rice (cooked), 1 cup / 3.5 g  White, long-grain rice (cooked), 1 cup / 0.6 g  Enriched macaroni (cooked), 1 cup / 2.5 g Legumes / Dietary Fiber (g)  Baked beans (canned, plain, or vegetarian),  cup / 5.2 g  Kidney beans (canned),  cup / 6.8 g  Pinto beans (cooked),  cup / 5.5 g Breads and Crackers / Dietary Fiber (g)  Plain or honey graham crackers, 2 squares / 0.7 g  Saltine crackers, 3 squares / 0.3 g  Plain, salted pretzels, 10 pieces / 1.8 g  Whole-wheat bread, 1 slice / 1.9 g  White bread, 1 slice / 0.7 g  Raisin bread, 1 slice / 1.2 g  Plain bagel, 3 oz / 2 g  Flour tortilla, 1 oz / 0.9 g  Corn tortilla, 1 small / 1.5 g  Hamburger or hotdog bun, 1 small / 0.9 g Fruits / Dietary Fiber (g)  Apple with skin, 1 medium / 4.4 g  Sweetened applesauce,  cup / 1.5 g  Banana,  medium / 1.5 g  Grapes, 10 grapes / 0.4 g  Orange, 1 small / 2.3 g  Raisin, 1.5 oz / 1.6 g  Melon, 1 cup / 1.4 g  Vegetables / Dietary Fiber (g)  Green beans (canned),  cup / 1.3 g  Carrots (cooked),  cup / 2.3 g  Broccoli (cooked),  cup / 2.8 g  Peas (cooked),  cup / 4.4 g  Mashed potatoes,  cup / 1.6 g  Lettuce, 1 cup / 0.5 g  Corn (canned),  cup / 1.6 g  Tomato,  cup / 1.1 g Document Released: 08/20/2005 Document Revised: 02/19/2012 Document Reviewed: 11/22/2011 [employer] Patient Information 2013 [employer].  __________________________________________________________________________________________________________________  Diet for Gastroesophageal Reflux Disease, Adult Reflux (acid reflux) is when acid from your stomach flows up into the esophagus. When acid comes in contact with the esophagus, the acid causes irritation and soreness (inflammation) in the esophagus. When reflux happens often or so severely that it  causes damage to the esophagus, it is called gastroesophageal reflux disease (GERD). Nutrition therapy can help ease the discomfort of GERD. FOODS OR DRINKS TO AVOID OR LIMIT  Smoking or chewing tobacco. Nicotine is one of the most potent stimulants to acid production in the gastrointestinal tract.  Caffeinated and decaffeinated coffee and black tea.  Regular or low-calorie carbonated beverages or energy drinks (caffeine-free carbonated beverages are allowed).   Strong spices, such as black pepper, white pepper, red pepper, cayenne, curry powder, and chili powder.  Peppermint or spearmint.  Chocolate.  High-fat foods, including meats and fried foods. Extra added fats including oils, butter, salad dressings, and nuts. Limit these to less than 8 tsp per day.  Fruits and vegetables if they are not tolerated, such as citrus fruits or tomatoes.  Alcohol.  Any food that seems to aggravate your condition. If you have questions regarding your diet, call your caregiver or a registered dietitian. OTHER THINGS THAT MAY HELP GERD INCLUDE:   Eating your meals slowly, in a relaxed setting.  Eating 5 to 6 small meals per day instead of 3 large meals.  Eliminating food for a period of time if it causes distress.  Not lying down until 3 hours after eating a meal.  Keeping the head of your bed raised 6 to 9 inches (15 to 23 cm) by using a foam wedge or blocks under the legs of the bed. Lying flat may make symptoms worse.  Being physically active. Weight loss may be helpful in reducing reflux in overweight or obese adults.  Wear loose fitting clothing EXAMPLE MEAL PLAN This meal plan is approximately 2,000 calories based on https://www.bernard.org/ meal planning guidelines. Breakfast   cup cooked oatmeal.  1 cup strawberries.  1 cup low-fat milk.  1 oz almonds. Snack  1 cup cucumber slices.  6 oz yogurt (made from low-fat or fat-free milk). Lunch  2 slice whole-wheat bread.  2 oz  sliced [place].  2 tsp mayonnaise.  1 cup blueberries.  1 cup snap peas. Snack  6 whole-wheat crackers.  1 oz string cheese. Dinner   cup brown rice.  1 cup mixed veggies.  1 tsp olive oil.  3 oz grilled fish. Document Released: 08/20/2005 Document Revised: 11/12/2011 Document Reviewed: 07/06/2011 [facility] Patient Information 2013 [place], [place].

## 2012-08-12 NOTE — Progress Notes (Signed)
This is a very nice 71 year old Caucasian female who had GI workup in January of this year, and everything was normal except for very long and redundant colon.  She now complains of acid reflux and regurgitation despite daily Prilosec.  She also has progressive solid and liquid food dysphagia with associated substernal chest pain.  She is status post open cholecystectomy.  She has mild nausea but no emesis, has generalized abdominal discomfort but no real gas, bloating, or bowel or regularity.  She also denies melena or hematochezia, jaundice, or other hepatobiliary complaints.  Patient denies been on antibiotics in the last year.  There no other neuromuscular problems or history of Raynaud's phenomenon.  Review of her previous labs shows normal as is her for a mildly elevated CRP of 6.  Review of her CT scan shows no abnormalities is up for some small benign hepatic cysts.  Current Medications, Allergies, Past Medical History, Past Surgical History, Family History and Social History were reviewed in [employer] record.  Pertinent Review of Systems Negative.... her cardiovascular or pulmonary symptoms.  She also denies palpitations, cough, shortness of breath, dyspnea etc.  There is no history of edema, recurrent phlebitis or pulmonary emboli.  Has a history of multiple drug allergies.   Physical Exam: Healthy-appearing patient in no distress appears stated age.  Blood pressure 126/84, pulse 76 and weight 198 with a BMI of 32.02.  97% oxygen saturation.  Cannot appreciate jaundice or stigmata of chronic liver disease.  Examination of the oral pharyngeal and neck areas are normal.  Chest is clear, and she is in a regular rhythm without murmurs gallops or rubs.  There is no abdominal distention, organomegaly, masses or tenderness.  There is a well-healed midline upper abdominal scar.  Bowel sounds are normal.  Peripheral extremities are unremarkable, and mental status is  normal.   Assessment and Plan: Probable worsening acid reflux with possible peptic stricture of the esophagus, rule out eosinophilic esophagitis, Candida esophagitis versus motility disorder.  I have changed her to Nexium 40 mg twice a day, Carafate suspension PC and each bedtime, and have scheduled her for endoscopy with biopsies and dilation ASAP.  I've schedule esophageal manometry, but this will take several weeks to complete.  Standard antireflux maneuvers reviewed with patient.  Labs ordered for review including CBC, liver profile, amylase and lipase. No diagnosis found.

## 2012-08-13 LAB — CELIAC PANEL 10
Endomysial Screen: NEGATIVE
Gliadin IgG: 5 U/mL (ref <20)
Tissue Transglut Ab: 10.4 U/mL (ref <20)
Tissue Transglutaminase Ab, IgA: 7.3 U/mL (ref <20)

## 2012-08-15 ENCOUNTER — Encounter: Payer: Self-pay | Admitting: Gastroenterology

## 2012-08-15 ENCOUNTER — Ambulatory Visit (AMBULATORY_SURGERY_CENTER): Payer: Medicare Other | Admitting: Gastroenterology

## 2012-08-15 VITALS — BP 130/68 | HR 83 | Temp 97.9°F | Resp 22 | Ht 66.0 in | Wt 198.0 lb

## 2012-08-15 DIAGNOSIS — R1314 Dysphagia, pharyngoesophageal phase: Secondary | ICD-10-CM

## 2012-08-15 DIAGNOSIS — K219 Gastro-esophageal reflux disease without esophagitis: Secondary | ICD-10-CM

## 2012-08-15 DIAGNOSIS — R109 Unspecified abdominal pain: Secondary | ICD-10-CM

## 2012-08-15 DIAGNOSIS — K7689 Other specified diseases of liver: Secondary | ICD-10-CM

## 2012-08-15 DIAGNOSIS — D130 Benign neoplasm of esophagus: Secondary | ICD-10-CM

## 2012-08-15 DIAGNOSIS — R079 Chest pain, unspecified: Secondary | ICD-10-CM

## 2012-08-15 MED ORDER — SODIUM CHLORIDE 0.9 % IV SOLN: 500.0000 mL | INTRAVENOUS | Status: DC

## 2012-08-15 NOTE — Progress Notes (Signed)
Patient did not experience any of the following events: a burn prior to discharge; a fall within the facility; wrong site/side/patient/procedure/implant event; or a hospital transfer or hospital admission upon discharge from the facility. (G8907) Patient did not have preoperative order for IV antibiotic SSI prophylaxis. (G8918)  

## 2012-08-15 NOTE — Op Note (Signed)
[facility] [address] [place], [address]   ENDOSCOPY PROCEDURE REPORT  PATIENT: Stephanie, Valenzuela  MR#: [id number] BIRTHDATE: 1940/11/01 , 71  yrs. old GENDER: Female ENDOSCOPIST:Levert Heslop Hale Bogus, MD, [facility] REFERRED BY: PROCEDURE DATE:  08/15/2012 PROCEDURE:   EGD w/ biopsy for H.pylori, EGD w/ biopsy , and Maloney dilation of esophagus ASA CLASS:    Class II INDICATIONS: Dysphagia, History of reflux esophagitis, and Heartburn. MEDICATION: propofol (Diprivan) 150mg  IV TOPICAL ANESTHETIC:   Cetacaine Spray  DESCRIPTION OF PROCEDURE:   After the risks and benefits of the procedure were explained, informed consent was obtained.  The LB GIF-H180 [id number]  endoscope was introduced through the mouth  and advanced to the second portion of the duodenum .  The instrument was slowly withdrawn as the mucosa was fully examined.      ESOPHAGUS: The mucosa of the esophagus appeared normal. Cold biopsies of the esophagus were performed  and the patient was then dilated with a #56 [place] Maloney dilator.  She tolerated this procedure well without pain or significant bleeding.   I could not appreciate a significant hiatal hernia or evidence of acid reflux injury to the esophagus.  STOMACH: There was mild antral gastropathy noted.  Cold forcep biopsies were taken at the antrum for CLO testing.  DUODENUM: The duodenal mucosa showed no abnormalities. Retroflexed views revealed no abnormalities.    The scope was then withdrawn from the patient and the procedure completed.  COMPLICATIONS: There were no complications.   ENDOSCOPIC IMPRESSION: 1.   The mucosa of the esophagus appeared normal ...biopsies for eosinophilic esophagitis done , and patient empirically dilated today. 2.   There was mild antral gastropathy noted [T2] .Marland KitchenMarland KitchenCLO Bx. done. 3.   The duodenal mucosa showed no abnormalities  RECOMMENDATIONS: 1.  Await pathology results 2.  Continue PPI 3.  My office  will arrange for you to have an esophageal manometry test performed.  This is an test of you muscle function of your esophagus.    _______________________________ eSignedMardella Layman, MD, [facility] 08/15/2012 3:59 PM  cc.. Dr  Maryelizabeth Rowan standard discharge   PATIENT NAME:  Stephanie, Valenzuela MR#: [id number]

## 2012-08-15 NOTE — Patient Instructions (Addendum)
YOU HAD AN ENDOSCOPIC PROCEDURE TODAY AT THE [facility]: Refer to the procedure report that was given to you for any specific questions about what was found during the examination.  If the procedure report does not answer your questions, please call your gastroenterologist to clarify.  If you requested that your care partner not be given the details of your procedure findings, then the procedure report has been included in a sealed envelope for you to review at your convenience later.  YOU SHOULD EXPECT: Some feelings of bloating in the abdomen. Passage of more gas than usual.  Walking can help get rid of the air that was put into your GI tract during the procedure and reduce the bloating. If you had a lower endoscopy (such as a colonoscopy or flexible sigmoidoscopy) you may notice spotting of blood in your stool or on the toilet paper. If you underwent a bowel prep for your procedure, then you may not have a normal bowel movement for a few days.  DIET: follow dilation diet  ACTIVITY: Your care partner should take you home directly after the procedure.  You should plan to take it easy, moving slowly for the rest of the day.  You can resume normal activity the day after the procedure however you should NOT DRIVE or use heavy machinery for 24 hours (because of the sedation medicines used during the test).    SYMPTOMS TO REPORT IMMEDIATELY: A gastroenterologist can be reached at any hour.  During normal business hours, 8:30 AM to 5:00 PM Monday through Friday, call [phone].  After hours and on weekends, please call the GI answering service at [phone] who will take a message and have the physician on call contact you.   Following upper endoscopy (EGD)  Vomiting of blood or coffee ground material  New chest pain or pain under the shoulder blades  Painful or persistently difficult swallowing  New shortness of breath  Fever of 100F or higher  Black, tarry-looking  stools  FOLLOW UP: If any biopsies were taken you will be contacted by phone or by letter within the next 1-3 weeks.  Call your gastroenterologist if you have not heard about the biopsies in 3 weeks.  Our staff will call the home number listed on your records the next business day following your procedure to check on you and address any questions or concerns that you may have at that time regarding the information given to you following your procedure. This is a courtesy call and so if there is no answer at the home number and we have not heard from you through the emergency physician on call, we will assume that you have returned to your regular daily activities without incident.  Continue antacids SIGNATURES/CONFIDENTIALITY: You and/or your care partner have signed paperwork which will be entered into your electronic medical record.  These signatures attest to the fact that that the information above on your After Visit Summary has been reviewed and is understood.  Full responsibility of the confidentiality of this discharge information lies with you and/or your care-partner.   Dilation diet instructions  Stricture handout given

## 2012-08-18 ENCOUNTER — Encounter: Payer: Self-pay | Admitting: Gastroenterology

## 2012-08-21 ENCOUNTER — Telehealth: Payer: Self-pay | Admitting: *Deleted

## 2012-08-21 NOTE — Telephone Encounter (Signed)
Called pt to schedule her EM, but she already has it scheduled for 10/06/12. She reports she's a lot better, but still has problems at times; she continues to use carafate and Nexium. Pt will call if she her problem worsens.

## 2012-08-22 ENCOUNTER — Encounter: Payer: Self-pay | Admitting: Gastroenterology

## 2012-10-06 ENCOUNTER — Encounter (HOSPITAL_COMMUNITY)
Admission: RE | Disposition: A | Discharge status: Home / Self Care | Payer: Self-pay | Source: Ambulatory Visit | Attending: Gastroenterology

## 2012-10-06 ENCOUNTER — Ambulatory Visit (HOSPITAL_COMMUNITY)
Admission: RE | Admit: 2012-10-06 | Discharge: 2012-10-06 | Disposition: A | Discharge status: Home / Self Care | Payer: Medicare Other | Source: Ambulatory Visit | Attending: Gastroenterology | Admitting: Gastroenterology

## 2012-10-06 DIAGNOSIS — R1310 Dysphagia, unspecified: Secondary | ICD-10-CM

## 2012-10-06 DIAGNOSIS — R1013 Epigastric pain: Secondary | ICD-10-CM | POA: Insufficient documentation

## 2012-10-06 HISTORY — PX: ESOPHAGEAL MANOMETRY: SHX5429

## 2012-10-06 SURGERY — MANOMETRY, ESOPHAGUS

## 2012-10-06 MED ORDER — LIDOCAINE VISCOUS 2 % MT SOLN
OROMUCOSAL | Status: AC
  Filled 2012-10-06: qty 15

## 2012-10-07 ENCOUNTER — Encounter (HOSPITAL_COMMUNITY): Payer: Self-pay | Admitting: Gastroenterology

## 2012-10-28 ENCOUNTER — Encounter: Payer: Self-pay | Admitting: Gastroenterology

## 2012-10-28 ENCOUNTER — Ambulatory Visit (INDEPENDENT_AMBULATORY_CARE_PROVIDER_SITE_OTHER): Payer: Medicare Other | Admitting: Gastroenterology

## 2012-10-28 VITALS — BP 110/74 | HR 88 | Ht 64.5 in | Wt 202.4 lb

## 2012-10-28 DIAGNOSIS — R1310 Dysphagia, unspecified: Secondary | ICD-10-CM

## 2012-10-28 NOTE — Progress Notes (Signed)
History of Present Illness: This is a very nice 72 year old Caucasian female with atypical chest pain.  Recent endoscopy empiric dilation has helped some of her dysphagia in her retropharyngeal area.  Follow PPI therapy was unsuccessful in terms of helping him her symptoms.  High-resolution esophageal manometry was entirely normal.  UES and LES pressures were normal, and there is no evidence of dysmotility although there was some incomplete bolus clearance 40% of the time.    Current Medications, Allergies, Past Medical History, Past Surgical History, Family History and Social History were reviewed in [employer] record.  ROS: All systems were reviewed and are negative unless otherwise stated in the HPI.         Assessment and plan: Nonspecific esophageal dysmotility with intermittent minor dysphagia.  There no changes of LES dysfunction/ achalasia.  I've asked her to continue when necessary Carafate, she may need periodic dilations.  I reviewed her labs with her from her church, and I do not think she has no significant liver function test elevation.  Exam today showed no evidence of hepatosplenomegaly or stigmata of chronic liver disease.  Vital signs were all normal.  I have offered repeat liver function test abdominal ultrasound, but the patient declined.  I suspect she has mild fatty infiltration of her liver associated with the 20-30 pound weight gain over the last 2-3 years.  A BMI today was 35, but not over 30.  She certainly does not appear to be obese on physical exam. No diagnosis found.

## 2012-10-28 NOTE — Patient Instructions (Addendum)
CC: Dr Marcelle Overlie

## 2012-11-12 ENCOUNTER — Encounter: Payer: Self-pay | Admitting: Gastroenterology

## 2014-08-17 ENCOUNTER — Other Ambulatory Visit: Payer: Self-pay | Admitting: Obstetrics and Gynecology

## 2014-08-18 LAB — CYTOLOGY - PAP

## 2015-02-24 ENCOUNTER — Encounter: Payer: Self-pay | Admitting: Gastroenterology

## 2015-09-20 DIAGNOSIS — H1045 Other chronic allergic conjunctivitis: Secondary | ICD-10-CM | POA: Diagnosis not present

## 2015-09-21 DIAGNOSIS — Z0000 Encounter for general adult medical examination without abnormal findings: Secondary | ICD-10-CM | POA: Diagnosis not present

## 2015-09-27 DIAGNOSIS — Z1389 Encounter for screening for other disorder: Secondary | ICD-10-CM | POA: Diagnosis not present

## 2015-09-27 DIAGNOSIS — N183 Chronic kidney disease, stage 3 (moderate): Secondary | ICD-10-CM | POA: Diagnosis not present

## 2015-09-27 DIAGNOSIS — M859 Disorder of bone density and structure, unspecified: Secondary | ICD-10-CM | POA: Diagnosis not present

## 2015-09-27 DIAGNOSIS — R7309 Other abnormal glucose: Secondary | ICD-10-CM | POA: Diagnosis not present

## 2015-09-27 DIAGNOSIS — Z6834 Body mass index (BMI) 34.0-34.9, adult: Secondary | ICD-10-CM | POA: Diagnosis not present

## 2015-09-27 DIAGNOSIS — Z0000 Encounter for general adult medical examination without abnormal findings: Secondary | ICD-10-CM | POA: Diagnosis not present

## 2015-09-29 DIAGNOSIS — M545 Low back pain: Secondary | ICD-10-CM | POA: Diagnosis not present

## 2015-09-29 DIAGNOSIS — M9903 Segmental and somatic dysfunction of lumbar region: Secondary | ICD-10-CM | POA: Diagnosis not present

## 2015-09-29 DIAGNOSIS — M542 Cervicalgia: Secondary | ICD-10-CM | POA: Diagnosis not present

## 2015-09-29 DIAGNOSIS — M9902 Segmental and somatic dysfunction of thoracic region: Secondary | ICD-10-CM | POA: Diagnosis not present

## 2015-10-06 DIAGNOSIS — M542 Cervicalgia: Secondary | ICD-10-CM | POA: Diagnosis not present

## 2015-10-06 DIAGNOSIS — M9902 Segmental and somatic dysfunction of thoracic region: Secondary | ICD-10-CM | POA: Diagnosis not present

## 2015-10-06 DIAGNOSIS — M9903 Segmental and somatic dysfunction of lumbar region: Secondary | ICD-10-CM | POA: Diagnosis not present

## 2015-10-06 DIAGNOSIS — M545 Low back pain: Secondary | ICD-10-CM | POA: Diagnosis not present

## 2015-10-13 DIAGNOSIS — H1045 Other chronic allergic conjunctivitis: Secondary | ICD-10-CM | POA: Diagnosis not present

## 2015-10-13 DIAGNOSIS — H04123 Dry eye syndrome of bilateral lacrimal glands: Secondary | ICD-10-CM | POA: Diagnosis not present

## 2015-10-13 DIAGNOSIS — H2513 Age-related nuclear cataract, bilateral: Secondary | ICD-10-CM | POA: Diagnosis not present

## 2015-10-13 DIAGNOSIS — H524 Presbyopia: Secondary | ICD-10-CM | POA: Diagnosis not present

## 2015-10-18 DIAGNOSIS — J069 Acute upper respiratory infection, unspecified: Secondary | ICD-10-CM | POA: Diagnosis not present

## 2015-10-18 DIAGNOSIS — Z6833 Body mass index (BMI) 33.0-33.9, adult: Secondary | ICD-10-CM | POA: Diagnosis not present

## 2015-10-18 DIAGNOSIS — R140 Abdominal distension (gaseous): Secondary | ICD-10-CM | POA: Diagnosis not present

## 2015-11-22 DIAGNOSIS — M542 Cervicalgia: Secondary | ICD-10-CM | POA: Diagnosis not present

## 2015-11-22 DIAGNOSIS — M9902 Segmental and somatic dysfunction of thoracic region: Secondary | ICD-10-CM | POA: Diagnosis not present

## 2015-11-22 DIAGNOSIS — M545 Low back pain: Secondary | ICD-10-CM | POA: Diagnosis not present

## 2015-11-22 DIAGNOSIS — M9903 Segmental and somatic dysfunction of lumbar region: Secondary | ICD-10-CM | POA: Diagnosis not present

## 2015-12-15 DIAGNOSIS — Z1272 Encounter for screening for malignant neoplasm of vagina: Secondary | ICD-10-CM | POA: Diagnosis not present

## 2015-12-15 DIAGNOSIS — Z90710 Acquired absence of both cervix and uterus: Secondary | ICD-10-CM | POA: Diagnosis not present

## 2015-12-15 DIAGNOSIS — N390 Urinary tract infection, site not specified: Secondary | ICD-10-CM | POA: Diagnosis not present

## 2015-12-15 DIAGNOSIS — Z01419 Encounter for gynecological examination (general) (routine) without abnormal findings: Secondary | ICD-10-CM | POA: Diagnosis not present

## 2015-12-15 DIAGNOSIS — Z1231 Encounter for screening mammogram for malignant neoplasm of breast: Secondary | ICD-10-CM | POA: Diagnosis not present

## 2016-02-07 DIAGNOSIS — Z6834 Body mass index (BMI) 34.0-34.9, adult: Secondary | ICD-10-CM | POA: Diagnosis not present

## 2016-02-07 DIAGNOSIS — R140 Abdominal distension (gaseous): Secondary | ICD-10-CM | POA: Diagnosis not present

## 2016-02-07 DIAGNOSIS — J0100 Acute maxillary sinusitis, unspecified: Secondary | ICD-10-CM | POA: Diagnosis not present

## 2016-02-09 ENCOUNTER — Ambulatory Visit: Payer: Self-pay | Admitting: Gastroenterology

## 2016-02-16 DIAGNOSIS — M9902 Segmental and somatic dysfunction of thoracic region: Secondary | ICD-10-CM | POA: Diagnosis not present

## 2016-02-16 DIAGNOSIS — M542 Cervicalgia: Secondary | ICD-10-CM | POA: Diagnosis not present

## 2016-02-16 DIAGNOSIS — M545 Low back pain: Secondary | ICD-10-CM | POA: Diagnosis not present

## 2016-02-16 DIAGNOSIS — M9903 Segmental and somatic dysfunction of lumbar region: Secondary | ICD-10-CM | POA: Diagnosis not present

## 2016-02-23 DIAGNOSIS — M542 Cervicalgia: Secondary | ICD-10-CM | POA: Diagnosis not present

## 2016-02-23 DIAGNOSIS — M9903 Segmental and somatic dysfunction of lumbar region: Secondary | ICD-10-CM | POA: Diagnosis not present

## 2016-02-23 DIAGNOSIS — M545 Low back pain: Secondary | ICD-10-CM | POA: Diagnosis not present

## 2016-02-23 DIAGNOSIS — M9902 Segmental and somatic dysfunction of thoracic region: Secondary | ICD-10-CM | POA: Diagnosis not present

## 2016-02-29 DIAGNOSIS — J0141 Acute recurrent pansinusitis: Secondary | ICD-10-CM | POA: Insufficient documentation

## 2016-02-29 DIAGNOSIS — J302 Other seasonal allergic rhinitis: Secondary | ICD-10-CM | POA: Diagnosis not present

## 2016-03-07 DIAGNOSIS — M542 Cervicalgia: Secondary | ICD-10-CM | POA: Diagnosis not present

## 2016-03-07 DIAGNOSIS — M9903 Segmental and somatic dysfunction of lumbar region: Secondary | ICD-10-CM | POA: Diagnosis not present

## 2016-03-07 DIAGNOSIS — M545 Low back pain: Secondary | ICD-10-CM | POA: Diagnosis not present

## 2016-03-07 DIAGNOSIS — M9902 Segmental and somatic dysfunction of thoracic region: Secondary | ICD-10-CM | POA: Diagnosis not present

## 2016-03-12 DIAGNOSIS — H1045 Other chronic allergic conjunctivitis: Secondary | ICD-10-CM | POA: Diagnosis not present

## 2016-03-21 ENCOUNTER — Encounter: Payer: Self-pay | Admitting: Gastroenterology

## 2016-03-21 ENCOUNTER — Ambulatory Visit (INDEPENDENT_AMBULATORY_CARE_PROVIDER_SITE_OTHER): Payer: Medicare HMO | Admitting: Gastroenterology

## 2016-03-21 VITALS — BP 116/74 | HR 80 | Ht 64.57 in | Wt 209.2 lb

## 2016-03-21 DIAGNOSIS — R1013 Epigastric pain: Secondary | ICD-10-CM

## 2016-03-21 DIAGNOSIS — G8929 Other chronic pain: Secondary | ICD-10-CM

## 2016-03-21 DIAGNOSIS — R0982 Postnasal drip: Secondary | ICD-10-CM | POA: Insufficient documentation

## 2016-03-21 NOTE — Progress Notes (Signed)
75 year old complaining of post-nasal drip causing nausea and phlegm in her stomach for which she is seeing ENT.  They gave her a medication that caused a burning sensation in her esophagus, but that has resolved since completing that medication.  Also complaining of epigastric pain that is the same pain described as a "knot" that she had 3-4 years ago and was evaluated extensively by Dr. Sharlett Iles.  Patient got up and stated that she was better and said that she probably did not need to be here and walked out for unknown reasons.  Tried to question patient further as to why she was leaving and that I was confused as to why she was trying to leave, but patient left before visit complete.

## 2016-04-04 DIAGNOSIS — R69 Illness, unspecified: Secondary | ICD-10-CM | POA: Diagnosis not present

## 2016-04-21 DIAGNOSIS — H9209 Otalgia, unspecified ear: Secondary | ICD-10-CM | POA: Diagnosis not present

## 2016-04-21 DIAGNOSIS — J309 Allergic rhinitis, unspecified: Secondary | ICD-10-CM | POA: Diagnosis not present

## 2016-07-16 DIAGNOSIS — M545 Low back pain: Secondary | ICD-10-CM | POA: Diagnosis not present

## 2016-07-16 DIAGNOSIS — M9902 Segmental and somatic dysfunction of thoracic region: Secondary | ICD-10-CM | POA: Diagnosis not present

## 2016-07-16 DIAGNOSIS — M542 Cervicalgia: Secondary | ICD-10-CM | POA: Diagnosis not present

## 2016-07-16 DIAGNOSIS — M9903 Segmental and somatic dysfunction of lumbar region: Secondary | ICD-10-CM | POA: Diagnosis not present

## 2016-09-14 DIAGNOSIS — Z1211 Encounter for screening for malignant neoplasm of colon: Secondary | ICD-10-CM | POA: Diagnosis not present

## 2016-09-14 DIAGNOSIS — K219 Gastro-esophageal reflux disease without esophagitis: Secondary | ICD-10-CM | POA: Diagnosis not present

## 2016-09-25 DIAGNOSIS — R7309 Other abnormal glucose: Secondary | ICD-10-CM | POA: Diagnosis not present

## 2016-09-25 DIAGNOSIS — M859 Disorder of bone density and structure, unspecified: Secondary | ICD-10-CM | POA: Diagnosis not present

## 2016-10-02 DIAGNOSIS — R7309 Other abnormal glucose: Secondary | ICD-10-CM | POA: Diagnosis not present

## 2016-10-02 DIAGNOSIS — M859 Disorder of bone density and structure, unspecified: Secondary | ICD-10-CM | POA: Diagnosis not present

## 2016-10-02 DIAGNOSIS — Z6835 Body mass index (BMI) 35.0-35.9, adult: Secondary | ICD-10-CM | POA: Diagnosis not present

## 2016-10-02 DIAGNOSIS — Z23 Encounter for immunization: Secondary | ICD-10-CM | POA: Diagnosis not present

## 2016-10-02 DIAGNOSIS — N183 Chronic kidney disease, stage 3 (moderate): Secondary | ICD-10-CM | POA: Diagnosis not present

## 2016-10-02 DIAGNOSIS — Z0000 Encounter for general adult medical examination without abnormal findings: Secondary | ICD-10-CM | POA: Diagnosis not present

## 2016-10-02 DIAGNOSIS — Z1389 Encounter for screening for other disorder: Secondary | ICD-10-CM | POA: Diagnosis not present

## 2016-10-02 DIAGNOSIS — E559 Vitamin D deficiency, unspecified: Secondary | ICD-10-CM | POA: Diagnosis not present

## 2016-10-02 DIAGNOSIS — K2960 Other gastritis without bleeding: Secondary | ICD-10-CM | POA: Diagnosis not present

## 2016-11-05 DIAGNOSIS — R69 Illness, unspecified: Secondary | ICD-10-CM | POA: Diagnosis not present

## 2016-11-29 DIAGNOSIS — J309 Allergic rhinitis, unspecified: Secondary | ICD-10-CM | POA: Diagnosis not present

## 2016-12-26 DIAGNOSIS — Z6833 Body mass index (BMI) 33.0-33.9, adult: Secondary | ICD-10-CM | POA: Diagnosis not present

## 2016-12-26 DIAGNOSIS — Z01419 Encounter for gynecological examination (general) (routine) without abnormal findings: Secondary | ICD-10-CM | POA: Diagnosis not present

## 2016-12-26 DIAGNOSIS — N958 Other specified menopausal and perimenopausal disorders: Secondary | ICD-10-CM | POA: Diagnosis not present

## 2016-12-26 DIAGNOSIS — Z1231 Encounter for screening mammogram for malignant neoplasm of breast: Secondary | ICD-10-CM | POA: Diagnosis not present

## 2016-12-26 DIAGNOSIS — M8588 Other specified disorders of bone density and structure, other site: Secondary | ICD-10-CM | POA: Diagnosis not present

## 2017-01-01 DIAGNOSIS — R69 Illness, unspecified: Secondary | ICD-10-CM | POA: Diagnosis not present

## 2017-05-08 DIAGNOSIS — J328 Other chronic sinusitis: Secondary | ICD-10-CM | POA: Diagnosis not present

## 2017-05-08 DIAGNOSIS — J028 Acute pharyngitis due to other specified organisms: Secondary | ICD-10-CM | POA: Diagnosis not present

## 2017-05-08 DIAGNOSIS — K219 Gastro-esophageal reflux disease without esophagitis: Secondary | ICD-10-CM | POA: Diagnosis not present

## 2017-05-08 DIAGNOSIS — D692 Other nonthrombocytopenic purpura: Secondary | ICD-10-CM | POA: Diagnosis not present

## 2017-05-08 DIAGNOSIS — Z6833 Body mass index (BMI) 33.0-33.9, adult: Secondary | ICD-10-CM | POA: Diagnosis not present

## 2017-06-03 DIAGNOSIS — R69 Illness, unspecified: Secondary | ICD-10-CM | POA: Diagnosis not present

## 2017-07-23 DIAGNOSIS — M545 Low back pain: Secondary | ICD-10-CM | POA: Diagnosis not present

## 2017-07-23 DIAGNOSIS — M542 Cervicalgia: Secondary | ICD-10-CM | POA: Diagnosis not present

## 2017-07-23 DIAGNOSIS — M9902 Segmental and somatic dysfunction of thoracic region: Secondary | ICD-10-CM | POA: Diagnosis not present

## 2017-07-23 DIAGNOSIS — M9903 Segmental and somatic dysfunction of lumbar region: Secondary | ICD-10-CM | POA: Diagnosis not present

## 2017-08-05 DIAGNOSIS — M9903 Segmental and somatic dysfunction of lumbar region: Secondary | ICD-10-CM | POA: Diagnosis not present

## 2017-08-05 DIAGNOSIS — M542 Cervicalgia: Secondary | ICD-10-CM | POA: Diagnosis not present

## 2017-08-05 DIAGNOSIS — M545 Low back pain: Secondary | ICD-10-CM | POA: Diagnosis not present

## 2017-08-05 DIAGNOSIS — M9902 Segmental and somatic dysfunction of thoracic region: Secondary | ICD-10-CM | POA: Diagnosis not present

## 2017-09-30 DIAGNOSIS — M542 Cervicalgia: Secondary | ICD-10-CM | POA: Diagnosis not present

## 2017-09-30 DIAGNOSIS — M545 Low back pain: Secondary | ICD-10-CM | POA: Diagnosis not present

## 2017-09-30 DIAGNOSIS — M9902 Segmental and somatic dysfunction of thoracic region: Secondary | ICD-10-CM | POA: Diagnosis not present

## 2017-09-30 DIAGNOSIS — M9903 Segmental and somatic dysfunction of lumbar region: Secondary | ICD-10-CM | POA: Diagnosis not present

## 2017-10-07 DIAGNOSIS — Z79899 Other long term (current) drug therapy: Secondary | ICD-10-CM | POA: Diagnosis not present

## 2017-10-07 DIAGNOSIS — N183 Chronic kidney disease, stage 3 (moderate): Secondary | ICD-10-CM | POA: Diagnosis not present

## 2017-10-07 DIAGNOSIS — R82998 Other abnormal findings in urine: Secondary | ICD-10-CM | POA: Diagnosis not present

## 2017-10-07 DIAGNOSIS — E559 Vitamin D deficiency, unspecified: Secondary | ICD-10-CM | POA: Diagnosis not present

## 2017-10-07 DIAGNOSIS — R7309 Other abnormal glucose: Secondary | ICD-10-CM | POA: Diagnosis not present

## 2017-10-14 DIAGNOSIS — H524 Presbyopia: Secondary | ICD-10-CM | POA: Diagnosis not present

## 2017-10-21 DIAGNOSIS — D692 Other nonthrombocytopenic purpura: Secondary | ICD-10-CM | POA: Diagnosis not present

## 2017-10-21 DIAGNOSIS — Z1389 Encounter for screening for other disorder: Secondary | ICD-10-CM | POA: Diagnosis not present

## 2017-10-21 DIAGNOSIS — Z0000 Encounter for general adult medical examination without abnormal findings: Secondary | ICD-10-CM | POA: Diagnosis not present

## 2017-10-21 DIAGNOSIS — K219 Gastro-esophageal reflux disease without esophagitis: Secondary | ICD-10-CM | POA: Diagnosis not present

## 2017-10-21 DIAGNOSIS — M859 Disorder of bone density and structure, unspecified: Secondary | ICD-10-CM | POA: Diagnosis not present

## 2017-10-21 DIAGNOSIS — R7309 Other abnormal glucose: Secondary | ICD-10-CM | POA: Diagnosis not present

## 2017-10-21 DIAGNOSIS — R238 Other skin changes: Secondary | ICD-10-CM | POA: Diagnosis not present

## 2017-10-21 DIAGNOSIS — Z6834 Body mass index (BMI) 34.0-34.9, adult: Secondary | ICD-10-CM | POA: Diagnosis not present

## 2017-10-21 DIAGNOSIS — N183 Chronic kidney disease, stage 3 (moderate): Secondary | ICD-10-CM | POA: Diagnosis not present

## 2017-10-21 DIAGNOSIS — E559 Vitamin D deficiency, unspecified: Secondary | ICD-10-CM | POA: Diagnosis not present

## 2017-10-24 DIAGNOSIS — Z1212 Encounter for screening for malignant neoplasm of rectum: Secondary | ICD-10-CM | POA: Diagnosis not present

## 2017-10-30 DIAGNOSIS — M542 Cervicalgia: Secondary | ICD-10-CM | POA: Diagnosis not present

## 2017-10-30 DIAGNOSIS — M9903 Segmental and somatic dysfunction of lumbar region: Secondary | ICD-10-CM | POA: Diagnosis not present

## 2017-10-30 DIAGNOSIS — M545 Low back pain: Secondary | ICD-10-CM | POA: Diagnosis not present

## 2017-10-30 DIAGNOSIS — M9902 Segmental and somatic dysfunction of thoracic region: Secondary | ICD-10-CM | POA: Diagnosis not present

## 2017-12-17 DIAGNOSIS — M79605 Pain in left leg: Secondary | ICD-10-CM | POA: Diagnosis not present

## 2017-12-19 DIAGNOSIS — M546 Pain in thoracic spine: Secondary | ICD-10-CM | POA: Diagnosis not present

## 2017-12-19 DIAGNOSIS — M9901 Segmental and somatic dysfunction of cervical region: Secondary | ICD-10-CM | POA: Diagnosis not present

## 2017-12-19 DIAGNOSIS — M50322 Other cervical disc degeneration at C5-C6 level: Secondary | ICD-10-CM | POA: Diagnosis not present

## 2017-12-19 DIAGNOSIS — M9902 Segmental and somatic dysfunction of thoracic region: Secondary | ICD-10-CM | POA: Diagnosis not present

## 2017-12-20 DIAGNOSIS — I8312 Varicose veins of left lower extremity with inflammation: Secondary | ICD-10-CM | POA: Diagnosis not present

## 2017-12-25 DIAGNOSIS — M9902 Segmental and somatic dysfunction of thoracic region: Secondary | ICD-10-CM | POA: Diagnosis not present

## 2017-12-25 DIAGNOSIS — M50322 Other cervical disc degeneration at C5-C6 level: Secondary | ICD-10-CM | POA: Diagnosis not present

## 2017-12-25 DIAGNOSIS — M546 Pain in thoracic spine: Secondary | ICD-10-CM | POA: Diagnosis not present

## 2017-12-25 DIAGNOSIS — M9901 Segmental and somatic dysfunction of cervical region: Secondary | ICD-10-CM | POA: Diagnosis not present

## 2018-01-01 DIAGNOSIS — I8312 Varicose veins of left lower extremity with inflammation: Secondary | ICD-10-CM | POA: Diagnosis not present

## 2018-01-01 DIAGNOSIS — M9901 Segmental and somatic dysfunction of cervical region: Secondary | ICD-10-CM | POA: Diagnosis not present

## 2018-01-01 DIAGNOSIS — M50322 Other cervical disc degeneration at C5-C6 level: Secondary | ICD-10-CM | POA: Diagnosis not present

## 2018-01-01 DIAGNOSIS — M9902 Segmental and somatic dysfunction of thoracic region: Secondary | ICD-10-CM | POA: Diagnosis not present

## 2018-01-01 DIAGNOSIS — M546 Pain in thoracic spine: Secondary | ICD-10-CM | POA: Diagnosis not present

## 2018-01-10 DIAGNOSIS — M546 Pain in thoracic spine: Secondary | ICD-10-CM | POA: Diagnosis not present

## 2018-01-10 DIAGNOSIS — Z6833 Body mass index (BMI) 33.0-33.9, adult: Secondary | ICD-10-CM | POA: Diagnosis not present

## 2018-01-10 DIAGNOSIS — M50322 Other cervical disc degeneration at C5-C6 level: Secondary | ICD-10-CM | POA: Diagnosis not present

## 2018-01-10 DIAGNOSIS — Z1231 Encounter for screening mammogram for malignant neoplasm of breast: Secondary | ICD-10-CM | POA: Diagnosis not present

## 2018-01-10 DIAGNOSIS — Z01419 Encounter for gynecological examination (general) (routine) without abnormal findings: Secondary | ICD-10-CM | POA: Diagnosis not present

## 2018-01-10 DIAGNOSIS — M9902 Segmental and somatic dysfunction of thoracic region: Secondary | ICD-10-CM | POA: Diagnosis not present

## 2018-01-10 DIAGNOSIS — N390 Urinary tract infection, site not specified: Secondary | ICD-10-CM | POA: Diagnosis not present

## 2018-01-10 DIAGNOSIS — M9901 Segmental and somatic dysfunction of cervical region: Secondary | ICD-10-CM | POA: Diagnosis not present

## 2018-01-15 DIAGNOSIS — M9901 Segmental and somatic dysfunction of cervical region: Secondary | ICD-10-CM | POA: Diagnosis not present

## 2018-01-15 DIAGNOSIS — M50322 Other cervical disc degeneration at C5-C6 level: Secondary | ICD-10-CM | POA: Diagnosis not present

## 2018-01-15 DIAGNOSIS — M9902 Segmental and somatic dysfunction of thoracic region: Secondary | ICD-10-CM | POA: Diagnosis not present

## 2018-01-15 DIAGNOSIS — M546 Pain in thoracic spine: Secondary | ICD-10-CM | POA: Diagnosis not present

## 2018-02-07 DIAGNOSIS — M9901 Segmental and somatic dysfunction of cervical region: Secondary | ICD-10-CM | POA: Diagnosis not present

## 2018-02-07 DIAGNOSIS — M50322 Other cervical disc degeneration at C5-C6 level: Secondary | ICD-10-CM | POA: Diagnosis not present

## 2018-02-07 DIAGNOSIS — M546 Pain in thoracic spine: Secondary | ICD-10-CM | POA: Diagnosis not present

## 2018-02-07 DIAGNOSIS — M9902 Segmental and somatic dysfunction of thoracic region: Secondary | ICD-10-CM | POA: Diagnosis not present

## 2018-02-12 DIAGNOSIS — M50322 Other cervical disc degeneration at C5-C6 level: Secondary | ICD-10-CM | POA: Diagnosis not present

## 2018-02-12 DIAGNOSIS — M546 Pain in thoracic spine: Secondary | ICD-10-CM | POA: Diagnosis not present

## 2018-02-12 DIAGNOSIS — M9901 Segmental and somatic dysfunction of cervical region: Secondary | ICD-10-CM | POA: Diagnosis not present

## 2018-02-12 DIAGNOSIS — M9902 Segmental and somatic dysfunction of thoracic region: Secondary | ICD-10-CM | POA: Diagnosis not present

## 2018-02-19 DIAGNOSIS — M9901 Segmental and somatic dysfunction of cervical region: Secondary | ICD-10-CM | POA: Diagnosis not present

## 2018-02-19 DIAGNOSIS — M546 Pain in thoracic spine: Secondary | ICD-10-CM | POA: Diagnosis not present

## 2018-02-19 DIAGNOSIS — M50322 Other cervical disc degeneration at C5-C6 level: Secondary | ICD-10-CM | POA: Diagnosis not present

## 2018-02-19 DIAGNOSIS — M9902 Segmental and somatic dysfunction of thoracic region: Secondary | ICD-10-CM | POA: Diagnosis not present

## 2018-02-26 DIAGNOSIS — M9902 Segmental and somatic dysfunction of thoracic region: Secondary | ICD-10-CM | POA: Diagnosis not present

## 2018-02-26 DIAGNOSIS — M546 Pain in thoracic spine: Secondary | ICD-10-CM | POA: Diagnosis not present

## 2018-02-26 DIAGNOSIS — M50322 Other cervical disc degeneration at C5-C6 level: Secondary | ICD-10-CM | POA: Diagnosis not present

## 2018-02-26 DIAGNOSIS — M9901 Segmental and somatic dysfunction of cervical region: Secondary | ICD-10-CM | POA: Diagnosis not present

## 2018-03-12 DIAGNOSIS — M9901 Segmental and somatic dysfunction of cervical region: Secondary | ICD-10-CM | POA: Diagnosis not present

## 2018-03-12 DIAGNOSIS — M546 Pain in thoracic spine: Secondary | ICD-10-CM | POA: Diagnosis not present

## 2018-03-12 DIAGNOSIS — M50322 Other cervical disc degeneration at C5-C6 level: Secondary | ICD-10-CM | POA: Diagnosis not present

## 2018-03-12 DIAGNOSIS — M9902 Segmental and somatic dysfunction of thoracic region: Secondary | ICD-10-CM | POA: Diagnosis not present

## 2018-03-19 DIAGNOSIS — M9902 Segmental and somatic dysfunction of thoracic region: Secondary | ICD-10-CM | POA: Diagnosis not present

## 2018-03-19 DIAGNOSIS — M546 Pain in thoracic spine: Secondary | ICD-10-CM | POA: Diagnosis not present

## 2018-03-19 DIAGNOSIS — M50322 Other cervical disc degeneration at C5-C6 level: Secondary | ICD-10-CM | POA: Diagnosis not present

## 2018-03-19 DIAGNOSIS — M9901 Segmental and somatic dysfunction of cervical region: Secondary | ICD-10-CM | POA: Diagnosis not present

## 2018-03-26 DIAGNOSIS — M9901 Segmental and somatic dysfunction of cervical region: Secondary | ICD-10-CM | POA: Diagnosis not present

## 2018-03-26 DIAGNOSIS — M50322 Other cervical disc degeneration at C5-C6 level: Secondary | ICD-10-CM | POA: Diagnosis not present

## 2018-03-26 DIAGNOSIS — M9902 Segmental and somatic dysfunction of thoracic region: Secondary | ICD-10-CM | POA: Diagnosis not present

## 2018-03-26 DIAGNOSIS — M546 Pain in thoracic spine: Secondary | ICD-10-CM | POA: Diagnosis not present

## 2018-03-28 DIAGNOSIS — I8312 Varicose veins of left lower extremity with inflammation: Secondary | ICD-10-CM | POA: Diagnosis not present

## 2018-03-31 DIAGNOSIS — I8312 Varicose veins of left lower extremity with inflammation: Secondary | ICD-10-CM | POA: Diagnosis not present

## 2018-04-02 DIAGNOSIS — M546 Pain in thoracic spine: Secondary | ICD-10-CM | POA: Diagnosis not present

## 2018-04-02 DIAGNOSIS — M9901 Segmental and somatic dysfunction of cervical region: Secondary | ICD-10-CM | POA: Diagnosis not present

## 2018-04-02 DIAGNOSIS — M50322 Other cervical disc degeneration at C5-C6 level: Secondary | ICD-10-CM | POA: Diagnosis not present

## 2018-04-02 DIAGNOSIS — M9902 Segmental and somatic dysfunction of thoracic region: Secondary | ICD-10-CM | POA: Diagnosis not present

## 2018-04-15 DIAGNOSIS — I8312 Varicose veins of left lower extremity with inflammation: Secondary | ICD-10-CM | POA: Diagnosis not present

## 2018-04-16 DIAGNOSIS — H1045 Other chronic allergic conjunctivitis: Secondary | ICD-10-CM | POA: Diagnosis not present

## 2018-04-28 DIAGNOSIS — M9901 Segmental and somatic dysfunction of cervical region: Secondary | ICD-10-CM | POA: Diagnosis not present

## 2018-04-28 DIAGNOSIS — M9902 Segmental and somatic dysfunction of thoracic region: Secondary | ICD-10-CM | POA: Diagnosis not present

## 2018-04-28 DIAGNOSIS — M50322 Other cervical disc degeneration at C5-C6 level: Secondary | ICD-10-CM | POA: Diagnosis not present

## 2018-04-28 DIAGNOSIS — M546 Pain in thoracic spine: Secondary | ICD-10-CM | POA: Diagnosis not present

## 2018-04-29 DIAGNOSIS — I8312 Varicose veins of left lower extremity with inflammation: Secondary | ICD-10-CM | POA: Diagnosis not present

## 2018-04-30 DIAGNOSIS — L821 Other seborrheic keratosis: Secondary | ICD-10-CM | POA: Diagnosis not present

## 2018-04-30 DIAGNOSIS — D692 Other nonthrombocytopenic purpura: Secondary | ICD-10-CM | POA: Diagnosis not present

## 2018-04-30 DIAGNOSIS — B353 Tinea pedis: Secondary | ICD-10-CM | POA: Diagnosis not present

## 2018-05-13 DIAGNOSIS — I8311 Varicose veins of right lower extremity with inflammation: Secondary | ICD-10-CM | POA: Diagnosis not present

## 2018-05-28 DIAGNOSIS — I8311 Varicose veins of right lower extremity with inflammation: Secondary | ICD-10-CM | POA: Diagnosis not present

## 2018-06-09 DIAGNOSIS — R69 Illness, unspecified: Secondary | ICD-10-CM | POA: Diagnosis not present

## 2018-06-27 DIAGNOSIS — R69 Illness, unspecified: Secondary | ICD-10-CM | POA: Diagnosis not present

## 2018-07-14 DIAGNOSIS — R69 Illness, unspecified: Secondary | ICD-10-CM | POA: Diagnosis not present

## 2018-07-25 DIAGNOSIS — M9901 Segmental and somatic dysfunction of cervical region: Secondary | ICD-10-CM | POA: Diagnosis not present

## 2018-07-25 DIAGNOSIS — M546 Pain in thoracic spine: Secondary | ICD-10-CM | POA: Diagnosis not present

## 2018-07-25 DIAGNOSIS — M9902 Segmental and somatic dysfunction of thoracic region: Secondary | ICD-10-CM | POA: Diagnosis not present

## 2018-07-25 DIAGNOSIS — M50322 Other cervical disc degeneration at C5-C6 level: Secondary | ICD-10-CM | POA: Diagnosis not present

## 2018-07-28 DIAGNOSIS — M9901 Segmental and somatic dysfunction of cervical region: Secondary | ICD-10-CM | POA: Diagnosis not present

## 2018-07-28 DIAGNOSIS — M546 Pain in thoracic spine: Secondary | ICD-10-CM | POA: Diagnosis not present

## 2018-07-28 DIAGNOSIS — M9902 Segmental and somatic dysfunction of thoracic region: Secondary | ICD-10-CM | POA: Diagnosis not present

## 2018-07-28 DIAGNOSIS — M50322 Other cervical disc degeneration at C5-C6 level: Secondary | ICD-10-CM | POA: Diagnosis not present

## 2018-07-30 DIAGNOSIS — R5381 Other malaise: Secondary | ICD-10-CM | POA: Diagnosis not present

## 2018-07-30 DIAGNOSIS — E559 Vitamin D deficiency, unspecified: Secondary | ICD-10-CM | POA: Diagnosis not present

## 2018-07-30 DIAGNOSIS — J3089 Other allergic rhinitis: Secondary | ICD-10-CM | POA: Diagnosis not present

## 2018-07-30 DIAGNOSIS — R82998 Other abnormal findings in urine: Secondary | ICD-10-CM | POA: Diagnosis not present

## 2018-07-30 DIAGNOSIS — R350 Frequency of micturition: Secondary | ICD-10-CM | POA: Diagnosis not present

## 2018-07-30 DIAGNOSIS — Z6833 Body mass index (BMI) 33.0-33.9, adult: Secondary | ICD-10-CM | POA: Diagnosis not present

## 2018-07-30 DIAGNOSIS — K219 Gastro-esophageal reflux disease without esophagitis: Secondary | ICD-10-CM | POA: Diagnosis not present

## 2018-07-30 DIAGNOSIS — M791 Myalgia, unspecified site: Secondary | ICD-10-CM | POA: Diagnosis not present

## 2018-07-30 DIAGNOSIS — N183 Chronic kidney disease, stage 3 (moderate): Secondary | ICD-10-CM | POA: Diagnosis not present

## 2018-07-30 DIAGNOSIS — K2960 Other gastritis without bleeding: Secondary | ICD-10-CM | POA: Diagnosis not present

## 2018-07-30 DIAGNOSIS — R1084 Generalized abdominal pain: Secondary | ICD-10-CM | POA: Diagnosis not present

## 2018-08-05 DIAGNOSIS — M9901 Segmental and somatic dysfunction of cervical region: Secondary | ICD-10-CM | POA: Diagnosis not present

## 2018-08-05 DIAGNOSIS — M546 Pain in thoracic spine: Secondary | ICD-10-CM | POA: Diagnosis not present

## 2018-08-05 DIAGNOSIS — M50322 Other cervical disc degeneration at C5-C6 level: Secondary | ICD-10-CM | POA: Diagnosis not present

## 2018-08-05 DIAGNOSIS — M9902 Segmental and somatic dysfunction of thoracic region: Secondary | ICD-10-CM | POA: Diagnosis not present

## 2018-08-12 DIAGNOSIS — M50322 Other cervical disc degeneration at C5-C6 level: Secondary | ICD-10-CM | POA: Diagnosis not present

## 2018-08-12 DIAGNOSIS — M9901 Segmental and somatic dysfunction of cervical region: Secondary | ICD-10-CM | POA: Diagnosis not present

## 2018-08-12 DIAGNOSIS — M546 Pain in thoracic spine: Secondary | ICD-10-CM | POA: Diagnosis not present

## 2018-08-12 DIAGNOSIS — M9902 Segmental and somatic dysfunction of thoracic region: Secondary | ICD-10-CM | POA: Diagnosis not present

## 2018-09-04 DIAGNOSIS — N3020 Other chronic cystitis without hematuria: Secondary | ICD-10-CM | POA: Diagnosis not present

## 2018-09-23 ENCOUNTER — Encounter: Payer: Self-pay | Admitting: Podiatry

## 2018-09-23 ENCOUNTER — Ambulatory Visit: Payer: Medicare HMO | Admitting: Podiatry

## 2018-09-23 VITALS — BP 123/75

## 2018-09-23 DIAGNOSIS — B351 Tinea unguium: Secondary | ICD-10-CM | POA: Diagnosis not present

## 2018-09-23 NOTE — Patient Instructions (Signed)

## 2018-10-01 DIAGNOSIS — M9902 Segmental and somatic dysfunction of thoracic region: Secondary | ICD-10-CM | POA: Diagnosis not present

## 2018-10-01 DIAGNOSIS — M546 Pain in thoracic spine: Secondary | ICD-10-CM | POA: Diagnosis not present

## 2018-10-01 DIAGNOSIS — M50322 Other cervical disc degeneration at C5-C6 level: Secondary | ICD-10-CM | POA: Diagnosis not present

## 2018-10-01 DIAGNOSIS — M9901 Segmental and somatic dysfunction of cervical region: Secondary | ICD-10-CM | POA: Diagnosis not present

## 2018-10-06 ENCOUNTER — Encounter: Payer: Self-pay | Admitting: Podiatry

## 2018-10-06 DIAGNOSIS — M9902 Segmental and somatic dysfunction of thoracic region: Secondary | ICD-10-CM | POA: Diagnosis not present

## 2018-10-06 DIAGNOSIS — M546 Pain in thoracic spine: Secondary | ICD-10-CM | POA: Diagnosis not present

## 2018-10-06 DIAGNOSIS — M9901 Segmental and somatic dysfunction of cervical region: Secondary | ICD-10-CM | POA: Diagnosis not present

## 2018-10-06 DIAGNOSIS — M50322 Other cervical disc degeneration at C5-C6 level: Secondary | ICD-10-CM | POA: Diagnosis not present

## 2018-10-06 NOTE — Progress Notes (Signed)
Subjective: Stephanie Valenzuela is a pleasant 78 year old female who presents today referred by Velna Hatchet, MD with cc of toenail fungus bilaterally.  Affected toenails are left great toe and bilateral fifth digits and left fourth toe.  Patient states that she has had toenail fungus for years.  She has used oral Lamisil, and laser therapy.  She states her laser therapy was performed 3 years ago and she reached received 2 treatments.  She would like to discuss to discuss what her other options are regarding nail fungus.   Past Medical History:  Diagnosis Date  . Allergy   . Anemia   . Anxiety   . Depression   . Esophageal reflux   . Irritable bowel syndrome   . Personal history of colonic polyps 2009   hyperplastic   . Ulcer    gastric  . Unspecified gastritis and gastroduodenitis without mention of hemorrhage      Patient Active Problem List   Diagnosis Date Noted  . Abdominal pain, chronic, epigastric 03/21/2016  . PND (post-nasal drip) 03/21/2016  . Acute recurrent pansinusitis 02/29/2016  . Abdominal pain 09/11/2011  . Abnormal bruising 08/22/2011  . UTI (urinary tract infection) 08/22/2011  . Allergic rhinitis 12/22/2010  . Deviated nasal septum 12/26/2009  . Tinea pedis 11/02/2009  . Overweight 10/25/2009  . VAGINITIS-CANDIDA 01/22/2008  . GERD 01/22/2008  . IRRITABLE BOWEL SYNDROME 01/22/2008  . DEPRESSION 01/20/2008  . ALLERGY 01/20/2008  . Adjustment disorder with mixed anxiety and depressed mood 01/20/2008  . ABDOMINAL PAIN-LUQ 12/26/2007  . GASTRITIS 11/21/2007     Past Surgical History:  Procedure Laterality Date  . ABDOMINAL HYSTERECTOMY    . APPENDECTOMY    . CHOLECYSTECTOMY    . ESOPHAGEAL MANOMETRY  10/06/2012   Procedure: ESOPHAGEAL MANOMETRY (EM);  Surgeon: Sable Feil, MD;  Location: [facility];  Service: Endoscopy;  Laterality: N/A;  . TONSILLECTOMY        Current Outpatient Medications:  .  ALPRAZolam (XANAX) 0.5 MG tablet, , Disp: ,  Rfl:  .  ciprofloxacin (CIPRO) 500 MG tablet, TAKE 1 TABLET BY MOUTH TWICE A DAY FOR 5 DAYS, Disp: , Rfl:  .  fluticasone (FLONASE) 50 MCG/ACT nasal spray, Place into the nose., Disp: , Rfl:  .  omeprazole (PRILOSEC) 20 MG capsule, Take 20 mg by mouth as needed. , Disp: , Rfl:  .  Probiotic Product (ALIGN PO), Take by mouth., Disp: , Rfl:    Allergies  Allergen Reactions  . Penicillins Hives  . Sulfamethoxazole Other (See Comments)    other  . Sulfonamide Derivatives Hives  . Codeine Palpitations  . Sulfur Itching     Social History   Occupational History  . Occupation: retired  Tobacco Use  . Smoking status: Former Research scientist (life sciences)  . Smokeless tobacco: Never Used  Substance and Sexual Activity  . Alcohol use: No  . Drug use: No  . Sexual activity: Not on file     Family History  Problem Relation Age of Onset  . Heart disease Father   . Crohn's disease Other        neice  . Kidney disease Maternal Grandfather   . Colon cancer Neg Hx   . Esophageal cancer Neg Hx   . Rectal cancer Neg Hx   . Stomach cancer Neg Hx       There is no immunization history on file for this patient.   Review of systems: Positive Findings in bold print.  Constitutional:  chills, fatigue, fever,  sweats, weight change Communication: Optometrist, sign Ecologist, hand writing, iPad/Android device Head: headaches, head injury Eyes: changes in vision, eye pain, glaucoma, cataracts, macular degeneration, diplopia, glare,  light sensitivity, eyeglasses or contacts, blindness Ears nose mouth throat: Hard of hearing, ringing in ears, deaf, sign language,  vertigo,   nosebleeds,  rhinitis,  cold sores, snoring, swollen glands Cardiovascular: HTN, edema, arrhythmia, pacemaker in place, defibrillator in place,  chest pain/tightness, chronic anticoagulation, blood clot, heart failure Peripheral Vascular: leg cramps, varicose veins, blood clots, lymphedema Respiratory:  difficulty breathing, denies  congestion, SOB, wheezing, cough, emphysema Gastrointestinal: change in appetite or weight, abdominal pain, constipation, diarrhea, nausea, vomiting, vomiting blood, change in bowel habits, abdominal pain, jaundice, rectal bleeding, hemorrhoids, GERD Genitourinary:  nocturia,  pain on urination,  blood in urine, Foley catheter, urinary urgency Musculoskeletal: uses mobility aid,  cramping, stiff joints, painful joints, decreased joint motion, fractures, OA, gout Skin: +changes in toenails, color change, dryness, itching, mole changes,  rash  Neurological: headaches, numbness in feet, paresthesias in feet, burning in feet, fainting,  seizures, change in speech. denies headaches, memory problems/poor historian, cerebral palsy, weakness, paralysis Endocrine: diabetes, hypothyroidism, hyperthyroidism,  goiter, dry mouth, flushing, heat intolerance,  cold intolerance,  excessive thirst, denies polyuria,  nocturia Hematological:  easy bleeding, excessive bleeding, easy bruising, enlarged lymph nodes, on long term blood thinner, history of past transusions Allergy/immunological:  hives, eczema, frequent infections, multiple drug allergies, seasonal allergies, transplant recipient Psychiatric:  anxiety, depression, mood disorder, suicidal ideations, hallucinations   Objective: Vascular Examination: Capillary refill time immediate x 10 digits Dorsalis pedis and posterior tibial pulses present b/l No digital hair x 10 digits Skin temperature gradient WNL b/l  Dermatological Examination: Skin with normal turgor, texture and tone b/l  Toenails left great toe, bilateral fifth digits, left fourth toe are discolored, thick, dystrophic with subungual debris and pain with palpation to nailbeds due to thickness of nails.  Musculoskeletal: Muscle strength 5/5 to all LE muscle groups  Neurological: Sensation intact with 10 gram monofilament Vibratory sensation intact.  Assessment: 1. Painful onychomycosis  toenails  left great toe, bilateral fifth digits, left fourth toe   Plan: 1. Discussed onychomycosis and treatment options.  Literature dispensed on today. 2. Consulted Marylou Mccoy for information on laser therapy procedure.  Patient is interested and will schedule her appointment with Janett Billow at her convenience. 3. Patient to continue soft, supportive shoe gear 4. Patient to report any pedal injuries to medical professional immediately. 5. Follow up 3 months to assess progress of laser therapy. 6. Patient/POA to call should there be a concern in the interim.

## 2018-10-13 ENCOUNTER — Other Ambulatory Visit: Payer: Self-pay | Admitting: Gastroenterology

## 2018-10-13 DIAGNOSIS — R112 Nausea with vomiting, unspecified: Secondary | ICD-10-CM | POA: Diagnosis not present

## 2018-10-13 DIAGNOSIS — R1084 Generalized abdominal pain: Secondary | ICD-10-CM

## 2018-10-14 DIAGNOSIS — M9901 Segmental and somatic dysfunction of cervical region: Secondary | ICD-10-CM | POA: Diagnosis not present

## 2018-10-14 DIAGNOSIS — M50322 Other cervical disc degeneration at C5-C6 level: Secondary | ICD-10-CM | POA: Diagnosis not present

## 2018-10-14 DIAGNOSIS — M546 Pain in thoracic spine: Secondary | ICD-10-CM | POA: Diagnosis not present

## 2018-10-14 DIAGNOSIS — M9902 Segmental and somatic dysfunction of thoracic region: Secondary | ICD-10-CM | POA: Diagnosis not present

## 2018-10-17 DIAGNOSIS — K3189 Other diseases of stomach and duodenum: Secondary | ICD-10-CM | POA: Diagnosis not present

## 2018-10-17 DIAGNOSIS — R112 Nausea with vomiting, unspecified: Secondary | ICD-10-CM | POA: Diagnosis not present

## 2018-10-17 DIAGNOSIS — R1013 Epigastric pain: Secondary | ICD-10-CM | POA: Diagnosis not present

## 2018-10-20 ENCOUNTER — Ambulatory Visit
Admission: RE | Admit: 2018-10-20 | Discharge: 2018-10-20 | Disposition: A | Discharge status: Home / Self Care | Payer: Medicare HMO | Source: Ambulatory Visit | Attending: Gastroenterology | Admitting: Gastroenterology

## 2018-10-20 DIAGNOSIS — K769 Liver disease, unspecified: Secondary | ICD-10-CM | POA: Diagnosis not present

## 2018-10-20 DIAGNOSIS — R112 Nausea with vomiting, unspecified: Secondary | ICD-10-CM

## 2018-10-20 DIAGNOSIS — R1084 Generalized abdominal pain: Secondary | ICD-10-CM

## 2018-10-20 MED ORDER — IOPAMIDOL (ISOVUE-300) INJECTION 61%
100.0000 mL | Freq: Once | INTRAVENOUS | Status: AC | PRN
  Administered 2018-10-20: 100 mL via INTRAVENOUS

## 2018-10-22 ENCOUNTER — Ambulatory Visit: Payer: Self-pay

## 2018-10-22 DIAGNOSIS — B351 Tinea unguium: Secondary | ICD-10-CM

## 2018-10-22 DIAGNOSIS — N183 Chronic kidney disease, stage 3 (moderate): Secondary | ICD-10-CM | POA: Diagnosis not present

## 2018-10-22 DIAGNOSIS — K3189 Other diseases of stomach and duodenum: Secondary | ICD-10-CM | POA: Diagnosis not present

## 2018-10-22 DIAGNOSIS — R82998 Other abnormal findings in urine: Secondary | ICD-10-CM | POA: Diagnosis not present

## 2018-10-22 DIAGNOSIS — Z79899 Other long term (current) drug therapy: Secondary | ICD-10-CM | POA: Diagnosis not present

## 2018-10-22 DIAGNOSIS — E559 Vitamin D deficiency, unspecified: Secondary | ICD-10-CM | POA: Diagnosis not present

## 2018-10-23 NOTE — Progress Notes (Signed)
Pt presents with mycotic infection of nails 1-5 bilateral.  All other systems are negative  Laser therapy administered to affected nails and tolerated well. All safety precautions were in place.  2nd treatment.  Follow up in 4 weeks     

## 2018-10-27 DIAGNOSIS — M50322 Other cervical disc degeneration at C5-C6 level: Secondary | ICD-10-CM | POA: Diagnosis not present

## 2018-10-27 DIAGNOSIS — M9902 Segmental and somatic dysfunction of thoracic region: Secondary | ICD-10-CM | POA: Diagnosis not present

## 2018-10-27 DIAGNOSIS — M9901 Segmental and somatic dysfunction of cervical region: Secondary | ICD-10-CM | POA: Diagnosis not present

## 2018-10-27 DIAGNOSIS — M546 Pain in thoracic spine: Secondary | ICD-10-CM | POA: Diagnosis not present

## 2018-10-29 DIAGNOSIS — Z6834 Body mass index (BMI) 34.0-34.9, adult: Secondary | ICD-10-CM | POA: Diagnosis not present

## 2018-10-29 DIAGNOSIS — M859 Disorder of bone density and structure, unspecified: Secondary | ICD-10-CM | POA: Diagnosis not present

## 2018-10-29 DIAGNOSIS — R1110 Vomiting, unspecified: Secondary | ICD-10-CM | POA: Diagnosis not present

## 2018-10-29 DIAGNOSIS — Z0000 Encounter for general adult medical examination without abnormal findings: Secondary | ICD-10-CM | POA: Diagnosis not present

## 2018-10-29 DIAGNOSIS — R7309 Other abnormal glucose: Secondary | ICD-10-CM | POA: Diagnosis not present

## 2018-10-29 DIAGNOSIS — Z1331 Encounter for screening for depression: Secondary | ICD-10-CM | POA: Diagnosis not present

## 2018-10-29 DIAGNOSIS — K219 Gastro-esophageal reflux disease without esophagitis: Secondary | ICD-10-CM | POA: Diagnosis not present

## 2018-10-29 DIAGNOSIS — E559 Vitamin D deficiency, unspecified: Secondary | ICD-10-CM | POA: Diagnosis not present

## 2018-10-29 DIAGNOSIS — Z23 Encounter for immunization: Secondary | ICD-10-CM | POA: Diagnosis not present

## 2018-10-29 DIAGNOSIS — N183 Chronic kidney disease, stage 3 (moderate): Secondary | ICD-10-CM | POA: Diagnosis not present

## 2018-11-03 DIAGNOSIS — M9902 Segmental and somatic dysfunction of thoracic region: Secondary | ICD-10-CM | POA: Diagnosis not present

## 2018-11-03 DIAGNOSIS — M50322 Other cervical disc degeneration at C5-C6 level: Secondary | ICD-10-CM | POA: Diagnosis not present

## 2018-11-03 DIAGNOSIS — Z1212 Encounter for screening for malignant neoplasm of rectum: Secondary | ICD-10-CM | POA: Diagnosis not present

## 2018-11-03 DIAGNOSIS — M546 Pain in thoracic spine: Secondary | ICD-10-CM | POA: Diagnosis not present

## 2018-11-03 DIAGNOSIS — M9901 Segmental and somatic dysfunction of cervical region: Secondary | ICD-10-CM | POA: Diagnosis not present

## 2018-11-04 DIAGNOSIS — H1045 Other chronic allergic conjunctivitis: Secondary | ICD-10-CM | POA: Diagnosis not present

## 2018-11-19 ENCOUNTER — Other Ambulatory Visit: Payer: Self-pay

## 2018-11-19 ENCOUNTER — Encounter: Payer: Self-pay | Admitting: Podiatry

## 2018-11-19 ENCOUNTER — Ambulatory Visit: Payer: Self-pay

## 2018-11-19 DIAGNOSIS — B351 Tinea unguium: Secondary | ICD-10-CM

## 2018-11-21 NOTE — Progress Notes (Signed)
Pt presents with mycotic infection of nails 1-5 bilateral.  All other systems are negative  Laser therapy administered to affected nails and tolerated well. All safety precautions were in place.  3rd treatment.  Follow up in 4 weeks     

## 2018-12-17 ENCOUNTER — Other Ambulatory Visit: Payer: Medicare HMO

## 2018-12-19 DIAGNOSIS — M545 Low back pain: Secondary | ICD-10-CM | POA: Diagnosis not present

## 2018-12-19 DIAGNOSIS — M50322 Other cervical disc degeneration at C5-C6 level: Secondary | ICD-10-CM | POA: Diagnosis not present

## 2018-12-19 DIAGNOSIS — M9901 Segmental and somatic dysfunction of cervical region: Secondary | ICD-10-CM | POA: Diagnosis not present

## 2018-12-19 DIAGNOSIS — M9902 Segmental and somatic dysfunction of thoracic region: Secondary | ICD-10-CM | POA: Diagnosis not present

## 2018-12-24 DIAGNOSIS — M9902 Segmental and somatic dysfunction of thoracic region: Secondary | ICD-10-CM | POA: Diagnosis not present

## 2018-12-24 DIAGNOSIS — M545 Low back pain: Secondary | ICD-10-CM | POA: Diagnosis not present

## 2018-12-24 DIAGNOSIS — M50322 Other cervical disc degeneration at C5-C6 level: Secondary | ICD-10-CM | POA: Diagnosis not present

## 2018-12-24 DIAGNOSIS — M9901 Segmental and somatic dysfunction of cervical region: Secondary | ICD-10-CM | POA: Diagnosis not present

## 2019-01-01 DIAGNOSIS — M9901 Segmental and somatic dysfunction of cervical region: Secondary | ICD-10-CM | POA: Diagnosis not present

## 2019-01-01 DIAGNOSIS — M50322 Other cervical disc degeneration at C5-C6 level: Secondary | ICD-10-CM | POA: Diagnosis not present

## 2019-01-01 DIAGNOSIS — M9902 Segmental and somatic dysfunction of thoracic region: Secondary | ICD-10-CM | POA: Diagnosis not present

## 2019-01-01 DIAGNOSIS — M545 Low back pain: Secondary | ICD-10-CM | POA: Diagnosis not present

## 2019-01-07 DIAGNOSIS — M50322 Other cervical disc degeneration at C5-C6 level: Secondary | ICD-10-CM | POA: Diagnosis not present

## 2019-01-07 DIAGNOSIS — M9902 Segmental and somatic dysfunction of thoracic region: Secondary | ICD-10-CM | POA: Diagnosis not present

## 2019-01-07 DIAGNOSIS — M545 Low back pain: Secondary | ICD-10-CM | POA: Diagnosis not present

## 2019-01-07 DIAGNOSIS — M9901 Segmental and somatic dysfunction of cervical region: Secondary | ICD-10-CM | POA: Diagnosis not present

## 2019-01-21 ENCOUNTER — Other Ambulatory Visit: Payer: Self-pay

## 2019-01-21 ENCOUNTER — Ambulatory Visit (INDEPENDENT_AMBULATORY_CARE_PROVIDER_SITE_OTHER): Payer: Self-pay

## 2019-01-21 DIAGNOSIS — B351 Tinea unguium: Secondary | ICD-10-CM

## 2019-01-21 NOTE — Progress Notes (Signed)
Pt presents with mycotic infection of nails 1-5 bilateral  All other systems are negative  Laser therapy administered to affected nails and tolerated well. All safety precautions were in place. 4th treatment.  Follow up in 4 weeks     

## 2019-01-30 DIAGNOSIS — R69 Illness, unspecified: Secondary | ICD-10-CM | POA: Diagnosis not present

## 2019-02-10 DIAGNOSIS — H524 Presbyopia: Secondary | ICD-10-CM | POA: Diagnosis not present

## 2019-02-10 DIAGNOSIS — H35363 Drusen (degenerative) of macula, bilateral: Secondary | ICD-10-CM | POA: Diagnosis not present

## 2019-02-17 DIAGNOSIS — Z1272 Encounter for screening for malignant neoplasm of vagina: Secondary | ICD-10-CM | POA: Diagnosis not present

## 2019-02-17 DIAGNOSIS — N390 Urinary tract infection, site not specified: Secondary | ICD-10-CM | POA: Diagnosis not present

## 2019-02-17 DIAGNOSIS — Z1231 Encounter for screening mammogram for malignant neoplasm of breast: Secondary | ICD-10-CM | POA: Diagnosis not present

## 2019-02-17 DIAGNOSIS — Z124 Encounter for screening for malignant neoplasm of cervix: Secondary | ICD-10-CM | POA: Diagnosis not present

## 2019-02-17 DIAGNOSIS — N958 Other specified menopausal and perimenopausal disorders: Secondary | ICD-10-CM | POA: Diagnosis not present

## 2019-02-17 DIAGNOSIS — Z6834 Body mass index (BMI) 34.0-34.9, adult: Secondary | ICD-10-CM | POA: Diagnosis not present

## 2019-02-17 DIAGNOSIS — M8588 Other specified disorders of bone density and structure, other site: Secondary | ICD-10-CM | POA: Diagnosis not present

## 2019-02-18 ENCOUNTER — Other Ambulatory Visit: Payer: Self-pay

## 2019-02-18 ENCOUNTER — Ambulatory Visit (INDEPENDENT_AMBULATORY_CARE_PROVIDER_SITE_OTHER): Payer: Medicare HMO | Admitting: Podiatry

## 2019-02-18 DIAGNOSIS — B351 Tinea unguium: Secondary | ICD-10-CM

## 2019-02-18 NOTE — Progress Notes (Signed)
Pt presents with mycotic infection of nails 1-5 bilateral.  All other systems are negative  Laser therapy administered to affected nails and tolerated well. All safety precautions were in place.  4th treatment.  Follow up in 2 months to recheck nail progress

## 2019-04-06 DIAGNOSIS — M9902 Segmental and somatic dysfunction of thoracic region: Secondary | ICD-10-CM | POA: Diagnosis not present

## 2019-04-06 DIAGNOSIS — M545 Low back pain: Secondary | ICD-10-CM | POA: Diagnosis not present

## 2019-04-06 DIAGNOSIS — K0530 Chronic periodontitis, unspecified: Secondary | ICD-10-CM | POA: Diagnosis not present

## 2019-04-06 DIAGNOSIS — M50322 Other cervical disc degeneration at C5-C6 level: Secondary | ICD-10-CM | POA: Diagnosis not present

## 2019-04-06 DIAGNOSIS — M9901 Segmental and somatic dysfunction of cervical region: Secondary | ICD-10-CM | POA: Diagnosis not present

## 2019-04-08 DIAGNOSIS — M9901 Segmental and somatic dysfunction of cervical region: Secondary | ICD-10-CM | POA: Diagnosis not present

## 2019-04-08 DIAGNOSIS — M545 Low back pain: Secondary | ICD-10-CM | POA: Diagnosis not present

## 2019-04-08 DIAGNOSIS — M9902 Segmental and somatic dysfunction of thoracic region: Secondary | ICD-10-CM | POA: Diagnosis not present

## 2019-04-08 DIAGNOSIS — M50322 Other cervical disc degeneration at C5-C6 level: Secondary | ICD-10-CM | POA: Diagnosis not present

## 2019-04-13 DIAGNOSIS — M545 Low back pain: Secondary | ICD-10-CM | POA: Diagnosis not present

## 2019-04-13 DIAGNOSIS — M9902 Segmental and somatic dysfunction of thoracic region: Secondary | ICD-10-CM | POA: Diagnosis not present

## 2019-04-13 DIAGNOSIS — M9901 Segmental and somatic dysfunction of cervical region: Secondary | ICD-10-CM | POA: Diagnosis not present

## 2019-04-13 DIAGNOSIS — M50322 Other cervical disc degeneration at C5-C6 level: Secondary | ICD-10-CM | POA: Diagnosis not present

## 2019-04-17 DIAGNOSIS — M9902 Segmental and somatic dysfunction of thoracic region: Secondary | ICD-10-CM | POA: Diagnosis not present

## 2019-04-17 DIAGNOSIS — M9901 Segmental and somatic dysfunction of cervical region: Secondary | ICD-10-CM | POA: Diagnosis not present

## 2019-04-17 DIAGNOSIS — M545 Low back pain: Secondary | ICD-10-CM | POA: Diagnosis not present

## 2019-04-17 DIAGNOSIS — M50322 Other cervical disc degeneration at C5-C6 level: Secondary | ICD-10-CM | POA: Diagnosis not present

## 2019-04-22 ENCOUNTER — Other Ambulatory Visit: Payer: Self-pay

## 2019-04-22 ENCOUNTER — Ambulatory Visit: Payer: Medicare HMO | Admitting: Podiatry

## 2019-04-22 DIAGNOSIS — M545 Low back pain: Secondary | ICD-10-CM | POA: Diagnosis not present

## 2019-04-22 DIAGNOSIS — M9902 Segmental and somatic dysfunction of thoracic region: Secondary | ICD-10-CM | POA: Diagnosis not present

## 2019-04-22 DIAGNOSIS — M9901 Segmental and somatic dysfunction of cervical region: Secondary | ICD-10-CM | POA: Diagnosis not present

## 2019-04-22 DIAGNOSIS — M79676 Pain in unspecified toe(s): Secondary | ICD-10-CM

## 2019-04-22 DIAGNOSIS — B351 Tinea unguium: Secondary | ICD-10-CM

## 2019-04-22 DIAGNOSIS — M50322 Other cervical disc degeneration at C5-C6 level: Secondary | ICD-10-CM | POA: Diagnosis not present

## 2019-04-27 DIAGNOSIS — K0530 Chronic periodontitis, unspecified: Secondary | ICD-10-CM | POA: Diagnosis not present

## 2019-04-29 DIAGNOSIS — M545 Low back pain: Secondary | ICD-10-CM | POA: Diagnosis not present

## 2019-04-29 DIAGNOSIS — M9902 Segmental and somatic dysfunction of thoracic region: Secondary | ICD-10-CM | POA: Diagnosis not present

## 2019-04-29 DIAGNOSIS — M50322 Other cervical disc degeneration at C5-C6 level: Secondary | ICD-10-CM | POA: Diagnosis not present

## 2019-04-29 DIAGNOSIS — M9901 Segmental and somatic dysfunction of cervical region: Secondary | ICD-10-CM | POA: Diagnosis not present

## 2019-05-06 DIAGNOSIS — M545 Low back pain: Secondary | ICD-10-CM | POA: Diagnosis not present

## 2019-05-06 DIAGNOSIS — M50322 Other cervical disc degeneration at C5-C6 level: Secondary | ICD-10-CM | POA: Diagnosis not present

## 2019-05-06 DIAGNOSIS — M9902 Segmental and somatic dysfunction of thoracic region: Secondary | ICD-10-CM | POA: Diagnosis not present

## 2019-05-06 DIAGNOSIS — M9901 Segmental and somatic dysfunction of cervical region: Secondary | ICD-10-CM | POA: Diagnosis not present

## 2019-05-13 DIAGNOSIS — M545 Low back pain: Secondary | ICD-10-CM | POA: Diagnosis not present

## 2019-05-13 DIAGNOSIS — M9901 Segmental and somatic dysfunction of cervical region: Secondary | ICD-10-CM | POA: Diagnosis not present

## 2019-05-13 DIAGNOSIS — M50322 Other cervical disc degeneration at C5-C6 level: Secondary | ICD-10-CM | POA: Diagnosis not present

## 2019-05-13 DIAGNOSIS — M9902 Segmental and somatic dysfunction of thoracic region: Secondary | ICD-10-CM | POA: Diagnosis not present

## 2019-05-18 DIAGNOSIS — M545 Low back pain: Secondary | ICD-10-CM | POA: Diagnosis not present

## 2019-05-18 DIAGNOSIS — M9901 Segmental and somatic dysfunction of cervical region: Secondary | ICD-10-CM | POA: Diagnosis not present

## 2019-05-18 DIAGNOSIS — M9902 Segmental and somatic dysfunction of thoracic region: Secondary | ICD-10-CM | POA: Diagnosis not present

## 2019-05-18 DIAGNOSIS — M50322 Other cervical disc degeneration at C5-C6 level: Secondary | ICD-10-CM | POA: Diagnosis not present

## 2019-05-27 DIAGNOSIS — R600 Localized edema: Secondary | ICD-10-CM | POA: Diagnosis not present

## 2019-05-27 DIAGNOSIS — I8311 Varicose veins of right lower extremity with inflammation: Secondary | ICD-10-CM | POA: Diagnosis not present

## 2019-05-29 NOTE — Progress Notes (Signed)
Pt presents with mycotic infection of nails 1-5 bilateral.  All other systems are negative  Laser therapy administered to affected nails and tolerated well. All safety precautions were in place.  5th treatment.  Follow up in 2 months to recheck nail progress.  Late entry requiring signature. Freddie Breech, DPM

## 2019-06-03 DIAGNOSIS — R600 Localized edema: Secondary | ICD-10-CM | POA: Diagnosis not present

## 2019-06-03 DIAGNOSIS — I8312 Varicose veins of left lower extremity with inflammation: Secondary | ICD-10-CM | POA: Diagnosis not present

## 2019-06-03 DIAGNOSIS — I8311 Varicose veins of right lower extremity with inflammation: Secondary | ICD-10-CM | POA: Diagnosis not present

## 2019-06-10 DIAGNOSIS — I8311 Varicose veins of right lower extremity with inflammation: Secondary | ICD-10-CM | POA: Diagnosis not present

## 2019-06-11 DIAGNOSIS — D2261 Melanocytic nevi of right upper limb, including shoulder: Secondary | ICD-10-CM | POA: Diagnosis not present

## 2019-06-11 DIAGNOSIS — D2262 Melanocytic nevi of left upper limb, including shoulder: Secondary | ICD-10-CM | POA: Diagnosis not present

## 2019-06-11 DIAGNOSIS — L821 Other seborrheic keratosis: Secondary | ICD-10-CM | POA: Diagnosis not present

## 2019-06-11 DIAGNOSIS — D225 Melanocytic nevi of trunk: Secondary | ICD-10-CM | POA: Diagnosis not present

## 2019-06-11 DIAGNOSIS — L814 Other melanin hyperpigmentation: Secondary | ICD-10-CM | POA: Diagnosis not present

## 2019-06-11 DIAGNOSIS — B353 Tinea pedis: Secondary | ICD-10-CM | POA: Diagnosis not present

## 2019-06-11 DIAGNOSIS — L918 Other hypertrophic disorders of the skin: Secondary | ICD-10-CM | POA: Diagnosis not present

## 2019-06-11 DIAGNOSIS — D2272 Melanocytic nevi of left lower limb, including hip: Secondary | ICD-10-CM | POA: Diagnosis not present

## 2019-06-11 DIAGNOSIS — L570 Actinic keratosis: Secondary | ICD-10-CM | POA: Diagnosis not present

## 2019-06-11 DIAGNOSIS — L720 Epidermal cyst: Secondary | ICD-10-CM | POA: Diagnosis not present

## 2019-07-17 ENCOUNTER — Other Ambulatory Visit: Payer: Medicare HMO

## 2019-07-22 ENCOUNTER — Other Ambulatory Visit: Payer: Medicare HMO

## 2019-10-30 DIAGNOSIS — M545 Low back pain: Secondary | ICD-10-CM | POA: Diagnosis not present

## 2019-10-30 DIAGNOSIS — M50322 Other cervical disc degeneration at C5-C6 level: Secondary | ICD-10-CM | POA: Diagnosis not present

## 2019-10-30 DIAGNOSIS — M9902 Segmental and somatic dysfunction of thoracic region: Secondary | ICD-10-CM | POA: Diagnosis not present

## 2019-10-30 DIAGNOSIS — M9901 Segmental and somatic dysfunction of cervical region: Secondary | ICD-10-CM | POA: Diagnosis not present

## 2019-11-03 DIAGNOSIS — M859 Disorder of bone density and structure, unspecified: Secondary | ICD-10-CM | POA: Diagnosis not present

## 2019-11-03 DIAGNOSIS — R350 Frequency of micturition: Secondary | ICD-10-CM | POA: Diagnosis not present

## 2019-11-03 DIAGNOSIS — Z0000 Encounter for general adult medical examination without abnormal findings: Secondary | ICD-10-CM | POA: Diagnosis not present

## 2019-11-03 DIAGNOSIS — R7989 Other specified abnormal findings of blood chemistry: Secondary | ICD-10-CM | POA: Diagnosis not present

## 2019-11-05 DIAGNOSIS — R82998 Other abnormal findings in urine: Secondary | ICD-10-CM | POA: Diagnosis not present

## 2019-11-05 DIAGNOSIS — Z0000 Encounter for general adult medical examination without abnormal findings: Secondary | ICD-10-CM | POA: Diagnosis not present

## 2019-11-10 DIAGNOSIS — Z0000 Encounter for general adult medical examination without abnormal findings: Secondary | ICD-10-CM | POA: Diagnosis not present

## 2019-11-10 DIAGNOSIS — G4700 Insomnia, unspecified: Secondary | ICD-10-CM | POA: Diagnosis not present

## 2019-11-10 DIAGNOSIS — R7309 Other abnormal glucose: Secondary | ICD-10-CM | POA: Diagnosis not present

## 2019-11-10 DIAGNOSIS — N390 Urinary tract infection, site not specified: Secondary | ICD-10-CM | POA: Diagnosis not present

## 2019-11-10 DIAGNOSIS — D692 Other nonthrombocytopenic purpura: Secondary | ICD-10-CM | POA: Diagnosis not present

## 2019-11-10 DIAGNOSIS — E559 Vitamin D deficiency, unspecified: Secondary | ICD-10-CM | POA: Diagnosis not present

## 2019-11-10 DIAGNOSIS — R32 Unspecified urinary incontinence: Secondary | ICD-10-CM | POA: Diagnosis not present

## 2019-11-10 DIAGNOSIS — M8580 Other specified disorders of bone density and structure, unspecified site: Secondary | ICD-10-CM | POA: Diagnosis not present

## 2019-11-10 DIAGNOSIS — Z23 Encounter for immunization: Secondary | ICD-10-CM | POA: Diagnosis not present

## 2019-11-10 DIAGNOSIS — K219 Gastro-esophageal reflux disease without esophagitis: Secondary | ICD-10-CM | POA: Diagnosis not present

## 2019-11-24 DIAGNOSIS — Z1212 Encounter for screening for malignant neoplasm of rectum: Secondary | ICD-10-CM | POA: Diagnosis not present

## 2020-03-03 DIAGNOSIS — M62830 Muscle spasm of back: Secondary | ICD-10-CM | POA: Diagnosis not present

## 2020-03-03 DIAGNOSIS — M9903 Segmental and somatic dysfunction of lumbar region: Secondary | ICD-10-CM | POA: Diagnosis not present

## 2020-03-03 DIAGNOSIS — M9901 Segmental and somatic dysfunction of cervical region: Secondary | ICD-10-CM | POA: Diagnosis not present

## 2020-03-03 DIAGNOSIS — M545 Low back pain: Secondary | ICD-10-CM | POA: Diagnosis not present

## 2020-03-04 DIAGNOSIS — M62830 Muscle spasm of back: Secondary | ICD-10-CM | POA: Diagnosis not present

## 2020-03-04 DIAGNOSIS — M9903 Segmental and somatic dysfunction of lumbar region: Secondary | ICD-10-CM | POA: Diagnosis not present

## 2020-03-04 DIAGNOSIS — M9901 Segmental and somatic dysfunction of cervical region: Secondary | ICD-10-CM | POA: Diagnosis not present

## 2020-03-04 DIAGNOSIS — M545 Low back pain: Secondary | ICD-10-CM | POA: Diagnosis not present

## 2020-03-09 DIAGNOSIS — M9901 Segmental and somatic dysfunction of cervical region: Secondary | ICD-10-CM | POA: Diagnosis not present

## 2020-03-09 DIAGNOSIS — M62830 Muscle spasm of back: Secondary | ICD-10-CM | POA: Diagnosis not present

## 2020-03-09 DIAGNOSIS — M545 Low back pain: Secondary | ICD-10-CM | POA: Diagnosis not present

## 2020-03-09 DIAGNOSIS — M9903 Segmental and somatic dysfunction of lumbar region: Secondary | ICD-10-CM | POA: Diagnosis not present

## 2020-03-16 DIAGNOSIS — M9901 Segmental and somatic dysfunction of cervical region: Secondary | ICD-10-CM | POA: Diagnosis not present

## 2020-03-16 DIAGNOSIS — M9903 Segmental and somatic dysfunction of lumbar region: Secondary | ICD-10-CM | POA: Diagnosis not present

## 2020-03-16 DIAGNOSIS — M62830 Muscle spasm of back: Secondary | ICD-10-CM | POA: Diagnosis not present

## 2020-03-16 DIAGNOSIS — M545 Low back pain: Secondary | ICD-10-CM | POA: Diagnosis not present

## 2020-03-23 DIAGNOSIS — M545 Low back pain: Secondary | ICD-10-CM | POA: Diagnosis not present

## 2020-03-23 DIAGNOSIS — M9903 Segmental and somatic dysfunction of lumbar region: Secondary | ICD-10-CM | POA: Diagnosis not present

## 2020-03-23 DIAGNOSIS — M62830 Muscle spasm of back: Secondary | ICD-10-CM | POA: Diagnosis not present

## 2020-03-23 DIAGNOSIS — M9901 Segmental and somatic dysfunction of cervical region: Secondary | ICD-10-CM | POA: Diagnosis not present

## 2020-03-29 DIAGNOSIS — M9901 Segmental and somatic dysfunction of cervical region: Secondary | ICD-10-CM | POA: Diagnosis not present

## 2020-03-29 DIAGNOSIS — M62830 Muscle spasm of back: Secondary | ICD-10-CM | POA: Diagnosis not present

## 2020-03-29 DIAGNOSIS — M545 Low back pain: Secondary | ICD-10-CM | POA: Diagnosis not present

## 2020-03-29 DIAGNOSIS — M9903 Segmental and somatic dysfunction of lumbar region: Secondary | ICD-10-CM | POA: Diagnosis not present

## 2020-04-07 DIAGNOSIS — J309 Allergic rhinitis, unspecified: Secondary | ICD-10-CM | POA: Diagnosis not present

## 2020-05-02 DIAGNOSIS — H353131 Nonexudative age-related macular degeneration, bilateral, early dry stage: Secondary | ICD-10-CM | POA: Diagnosis not present

## 2020-05-02 DIAGNOSIS — H5203 Hypermetropia, bilateral: Secondary | ICD-10-CM | POA: Diagnosis not present

## 2020-05-02 DIAGNOSIS — H2513 Age-related nuclear cataract, bilateral: Secondary | ICD-10-CM | POA: Diagnosis not present

## 2020-05-23 DIAGNOSIS — M9903 Segmental and somatic dysfunction of lumbar region: Secondary | ICD-10-CM | POA: Diagnosis not present

## 2020-05-23 DIAGNOSIS — M545 Low back pain: Secondary | ICD-10-CM | POA: Diagnosis not present

## 2020-05-23 DIAGNOSIS — M9901 Segmental and somatic dysfunction of cervical region: Secondary | ICD-10-CM | POA: Diagnosis not present

## 2020-05-23 DIAGNOSIS — M62830 Muscle spasm of back: Secondary | ICD-10-CM | POA: Diagnosis not present

## 2020-05-26 DIAGNOSIS — M9903 Segmental and somatic dysfunction of lumbar region: Secondary | ICD-10-CM | POA: Diagnosis not present

## 2020-05-26 DIAGNOSIS — M9902 Segmental and somatic dysfunction of thoracic region: Secondary | ICD-10-CM | POA: Diagnosis not present

## 2020-05-26 DIAGNOSIS — M545 Low back pain: Secondary | ICD-10-CM | POA: Diagnosis not present

## 2020-05-26 DIAGNOSIS — M9901 Segmental and somatic dysfunction of cervical region: Secondary | ICD-10-CM | POA: Diagnosis not present

## 2020-05-30 DIAGNOSIS — M545 Low back pain: Secondary | ICD-10-CM | POA: Diagnosis not present

## 2020-05-30 DIAGNOSIS — M9902 Segmental and somatic dysfunction of thoracic region: Secondary | ICD-10-CM | POA: Diagnosis not present

## 2020-05-30 DIAGNOSIS — M9901 Segmental and somatic dysfunction of cervical region: Secondary | ICD-10-CM | POA: Diagnosis not present

## 2020-05-30 DIAGNOSIS — M9903 Segmental and somatic dysfunction of lumbar region: Secondary | ICD-10-CM | POA: Diagnosis not present

## 2020-06-02 DIAGNOSIS — M545 Low back pain: Secondary | ICD-10-CM | POA: Diagnosis not present

## 2020-06-02 DIAGNOSIS — M9901 Segmental and somatic dysfunction of cervical region: Secondary | ICD-10-CM | POA: Diagnosis not present

## 2020-06-02 DIAGNOSIS — M62830 Muscle spasm of back: Secondary | ICD-10-CM | POA: Diagnosis not present

## 2020-06-02 DIAGNOSIS — M9903 Segmental and somatic dysfunction of lumbar region: Secondary | ICD-10-CM | POA: Diagnosis not present

## 2020-06-02 DIAGNOSIS — M9902 Segmental and somatic dysfunction of thoracic region: Secondary | ICD-10-CM | POA: Diagnosis not present

## 2020-06-07 DIAGNOSIS — M9901 Segmental and somatic dysfunction of cervical region: Secondary | ICD-10-CM | POA: Diagnosis not present

## 2020-06-07 DIAGNOSIS — M9903 Segmental and somatic dysfunction of lumbar region: Secondary | ICD-10-CM | POA: Diagnosis not present

## 2020-06-07 DIAGNOSIS — M545 Low back pain: Secondary | ICD-10-CM | POA: Diagnosis not present

## 2020-06-07 DIAGNOSIS — M62830 Muscle spasm of back: Secondary | ICD-10-CM | POA: Diagnosis not present

## 2020-06-07 DIAGNOSIS — M9902 Segmental and somatic dysfunction of thoracic region: Secondary | ICD-10-CM | POA: Diagnosis not present

## 2020-09-11 DIAGNOSIS — R5383 Other fatigue: Secondary | ICD-10-CM | POA: Diagnosis not present

## 2020-09-11 DIAGNOSIS — N3001 Acute cystitis with hematuria: Secondary | ICD-10-CM | POA: Diagnosis not present

## 2020-09-11 DIAGNOSIS — M549 Dorsalgia, unspecified: Secondary | ICD-10-CM | POA: Diagnosis not present

## 2020-09-11 DIAGNOSIS — N390 Urinary tract infection, site not specified: Secondary | ICD-10-CM | POA: Diagnosis not present

## 2020-09-14 ENCOUNTER — Emergency Department (HOSPITAL_COMMUNITY)
Admission: EM | Admit: 2020-09-14 | Discharge: 2020-09-14 | Disposition: A | Discharge status: Home / Self Care | Payer: Medicare HMO | Attending: Emergency Medicine | Admitting: Emergency Medicine

## 2020-09-14 ENCOUNTER — Other Ambulatory Visit: Payer: Self-pay

## 2020-09-14 ENCOUNTER — Encounter (HOSPITAL_COMMUNITY): Payer: Self-pay

## 2020-09-14 DIAGNOSIS — E860 Dehydration: Secondary | ICD-10-CM | POA: Insufficient documentation

## 2020-09-14 DIAGNOSIS — R5383 Other fatigue: Secondary | ICD-10-CM | POA: Diagnosis not present

## 2020-09-14 DIAGNOSIS — R197 Diarrhea, unspecified: Secondary | ICD-10-CM | POA: Diagnosis not present

## 2020-09-14 DIAGNOSIS — R102 Pelvic and perineal pain: Secondary | ICD-10-CM

## 2020-09-14 DIAGNOSIS — Z87891 Personal history of nicotine dependence: Secondary | ICD-10-CM | POA: Insufficient documentation

## 2020-09-14 DIAGNOSIS — R399 Unspecified symptoms and signs involving the genitourinary system: Secondary | ICD-10-CM

## 2020-09-14 DIAGNOSIS — N390 Urinary tract infection, site not specified: Secondary | ICD-10-CM | POA: Insufficient documentation

## 2020-09-14 DIAGNOSIS — R1030 Lower abdominal pain, unspecified: Secondary | ICD-10-CM | POA: Diagnosis not present

## 2020-09-14 LAB — CBC WITH DIFFERENTIAL/PLATELET
Abs Immature Granulocytes: 0.02 10*3/uL (ref 0.00–0.07)
Basophils Absolute: 0 10*3/uL (ref 0.0–0.1)
Basophils Relative: 1 %
Eosinophils Absolute: 0 10*3/uL (ref 0.0–0.5)
Eosinophils Relative: 0 %
HCT: 46.8 % — ABNORMAL HIGH (ref 36.0–46.0)
Hemoglobin: 15.5 g/dL — ABNORMAL HIGH (ref 12.0–15.0)
Immature Granulocytes: 1 %
Lymphocytes Relative: 24 %
Lymphs Abs: 0.8 10*3/uL (ref 0.7–4.0)
MCH: 29.9 pg (ref 26.0–34.0)
MCHC: 33.1 g/dL (ref 30.0–36.0)
MCV: 90.3 fL (ref 80.0–100.0)
Monocytes Absolute: 0.6 10*3/uL (ref 0.1–1.0)
Monocytes Relative: 18 %
Neutro Abs: 2 10*3/uL (ref 1.7–7.7)
Neutrophils Relative %: 56 %
Platelets: 268 10*3/uL (ref 150–400)
RBC: 5.18 MIL/uL — ABNORMAL HIGH (ref 3.87–5.11)
RDW: 13.1 % (ref 11.5–15.5)
WBC: 3.5 10*3/uL — ABNORMAL LOW (ref 4.0–10.5)
nRBC: 0 % (ref 0.0–0.2)

## 2020-09-14 LAB — BASIC METABOLIC PANEL
Anion gap: 14 (ref 5–15)
BUN: 11 mg/dL (ref 8–23)
CO2: 22 mmol/L (ref 22–32)
Calcium: 8.7 mg/dL — ABNORMAL LOW (ref 8.9–10.3)
Chloride: 99 mmol/L (ref 98–111)
Creatinine, Ser: 0.91 mg/dL (ref 0.44–1.00)
GFR, Estimated: >60 mL/min (ref >60)
Glucose, Bld: 106 mg/dL — ABNORMAL HIGH (ref 70–99)
Potassium: 3.8 mmol/L (ref 3.5–5.1)
Sodium: 135 mmol/L (ref 135–145)

## 2020-09-14 LAB — URINALYSIS, ROUTINE W REFLEX MICROSCOPIC
Bilirubin Urine: NEGATIVE
Glucose, UA: NEGATIVE mg/dL
Hgb urine dipstick: NEGATIVE
Ketones, ur: 5 mg/dL — AB
Leukocytes,Ua: NEGATIVE
Nitrite: NEGATIVE
Protein, ur: NEGATIVE mg/dL
Specific Gravity, Urine: 1.004 — ABNORMAL LOW (ref 1.005–1.030)
pH: 6 (ref 5.0–8.0)

## 2020-09-14 MED ORDER — CIPROFLOXACIN HCL 500 MG PO TABS: 500.0000 mg | ORAL_TABLET | Freq: Two times a day (BID) | ORAL | 0 refills | Status: AC

## 2020-09-14 MED ORDER — CIPROFLOXACIN HCL 500 MG PO TABS: 500.0000 mg | ORAL_TABLET | Freq: Two times a day (BID) | ORAL | 0 refills | Status: DC

## 2020-09-14 MED ORDER — SODIUM CHLORIDE 0.9 % IV BOLUS
1000.0000 mL | Freq: Once | INTRAVENOUS | Status: AC
  Administered 2020-09-14: 1000 mL via INTRAVENOUS

## 2020-09-14 MED ORDER — CIPROFLOXACIN IN D5W 400 MG/200ML IV SOLN
400.0000 mg | Freq: Once | INTRAVENOUS | Status: AC
  Administered 2020-09-14: 400 mg via INTRAVENOUS
  Filled 2020-09-14: qty 200

## 2020-09-14 MED ORDER — SODIUM CHLORIDE 0.9 % IV SOLN: 2.0000 g | Freq: Once | INTRAVENOUS | Status: DC

## 2020-09-14 NOTE — ED Triage Notes (Signed)
Pt arrived via walk in, states she was dx with UTI x3 days ago at urgent care, states she started macrobid sun. States had a little diarrhea after the start of medication, and  she is feeling dehydrated. States that she needs IV fluid as that is what she believes will clear up the UTI.

## 2020-09-14 NOTE — Discharge Instructions (Signed)
Please read the attachment UTI.  Increase your oral hydration intake.  Eat regular meals.  Check your temperature regularly and take Tylenol as needed for fever control.  I have prescribed Cipro, please take as directed.  You were also treated here in the ED with IV Cipro and IV fluids.  Follow-up with your primary care provider regarding today's encounter for ongoing evaluation and management.

## 2020-09-14 NOTE — ED Provider Notes (Signed)
[facility] DEPT Provider Note   CSN: [id number] Arrival date & time: 09/14/20  0955     History Chief Complaint  Patient presents with  . Urinary Tract Infection    Stephanie Valenzuela is a 80 y.o. female with no significant past medical history who presents to the ED requesting IV fluid resuscitation for her previously diagnosed UTI.  I reviewed patient's medical record and I cannot see the urgent care encounter, but patient reports that she went to an urgent care a few days ago and was diagnosed with UTI.  She was subsequently placed on Macrobid despite her age, likely given her reported allergies to sulfamethoxazole and penicillin, and states that she has since developed dehydration in the setting of diarrhea.  On my examination, patient reports that she is followed by her primary care provider at [facility].  She usually calls them regarding her UTIs and they will prescribe her antibiotics, but last week when she developed symptoms, they advised her to go to an urgent care or an ER for evaluation.  She states that on 09/11/2020 she went to an urgent care and they discharged her home with Macrobid which she states is totally useless.  She tells me that her primary care provider had prescribed her Cipro and may be Keflex in the past.  She states that the penicillin allergy that she had was "way in the past".  She also states that she is continuing to endorse lower abdominal "fullness" in addition to urinary symptoms.  She is insisting on a new antibiotic.  She states that she knows that she needs to get an IV fluid bolus in order to feel better.  She states that she is eating and drinking fine.  Mild nausea, but no emesis.  She also denies any fevers, chills, flank pain, vaginal bleeding, pelvic discharge, or other symptoms.  HPI     Past Medical History:  Diagnosis Date  . Allergy   . Anemia   . Anxiety   . Depression   . Esophageal reflux    . Irritable bowel syndrome   . Personal history of colonic polyps 2009   hyperplastic   . Ulcer    gastric  . Unspecified gastritis and gastroduodenitis without mention of hemorrhage     Patient Active Problem List   Diagnosis Date Noted  . Abdominal pain, chronic, epigastric 03/21/2016  . PND (post-nasal drip) 03/21/2016  . Acute recurrent pansinusitis 02/29/2016  . Abdominal pain 09/11/2011  . Abnormal bruising 08/22/2011  . UTI (urinary tract infection) 08/22/2011  . Allergic rhinitis 12/22/2010  . Deviated nasal septum 12/26/2009  . Tinea pedis 11/02/2009  . Overweight 10/25/2009  . VAGINITIS-CANDIDA 01/22/2008  . GERD 01/22/2008  . IRRITABLE BOWEL SYNDROME 01/22/2008  . DEPRESSION 01/20/2008  . ALLERGY 01/20/2008  . Adjustment disorder with mixed anxiety and depressed mood 01/20/2008  . ABDOMINAL PAIN-LUQ 12/26/2007  . GASTRITIS 11/21/2007    Past Surgical History:  Procedure Laterality Date  . ABDOMINAL HYSTERECTOMY    . APPENDECTOMY    . CHOLECYSTECTOMY    . ESOPHAGEAL MANOMETRY  10/06/2012   Procedure: ESOPHAGEAL MANOMETRY (EM);  Surgeon: Sable Feil, MD;  Location: [facility];  Service: Endoscopy;  Laterality: N/A;  . TONSILLECTOMY       OB History   No obstetric history on file.     Family History  Problem Relation Age of Onset  . Heart disease Father   . Crohn's disease Other  neice  . Kidney disease Maternal Grandfather   . Colon cancer Neg Hx   . Esophageal cancer Neg Hx   . Rectal cancer Neg Hx   . Stomach cancer Neg Hx     Social History   Tobacco Use  . Smoking status: Former Research scientist (life sciences)  . Smokeless tobacco: Never Used  Substance Use Topics  . Alcohol use: No  . Drug use: No    Home Medications Prior to Admission medications   Medication Sig Start Date End Date Taking? Authorizing Provider  ciprofloxacin (CIPRO) 500 MG tablet Take 1 tablet (500 mg total) by mouth 2 (two) times daily for 3 days. 09/14/20 09/17/20 Yes Corena Herter, PA-C  ALPRAZolam Duanne Moron) 0.5 MG tablet  02/08/16   [provider]  fluticasone (FLONASE) 50 MCG/ACT nasal spray Place into the nose.    [provider]  omeprazole (PRILOSEC) 20 MG capsule Take 20 mg by mouth as needed.     [provider]  Probiotic Product (ALIGN PO) Take by mouth.    [provider]    Allergies    Penicillins, Sulfamethoxazole, Sulfonamide derivatives, Codeine, and Sulfur  Review of Systems   Review of Systems  All other systems reviewed and are negative.   Physical Exam Updated Vital Signs BP 132/76   Pulse 80   Temp (!) 97.5 F (36.4 C) (Oral)   Resp 18   SpO2 95%   Physical Exam Vitals and nursing note reviewed. Exam conducted with a chaperone present.  Constitutional:      General: She is not in acute distress.    Appearance: Normal appearance. She is not ill-appearing or toxic-appearing.  HENT:     Head: Normocephalic and atraumatic.  Eyes:     General: No scleral icterus.    Conjunctiva/sclera: Conjunctivae normal.  Cardiovascular:     Rate and Rhythm: Normal rate.     Pulses: Normal pulses.  Pulmonary:     Effort: Pulmonary effort is normal.  Abdominal:     General: Abdomen is flat. There is no distension.     Palpations: Abdomen is soft. There is no mass.     Tenderness: There is no abdominal tenderness. There is no right CVA tenderness, left CVA tenderness or guarding.     Comments: Soft, nondistended.  Very mild TTP in suprapubic region.  No TTP elsewhere.  No guarding.  No overlying skin changes.  No CVAT bilaterally.  Skin:    General: Skin is dry.  Neurological:     Mental Status: She is alert.     GCS: GCS eye subscore is 4. GCS verbal subscore is 5. GCS motor subscore is 6.  Psychiatric:        Mood and Affect: Mood normal.        Behavior: Behavior normal.        Thought Content: Thought content normal.     ED Results / Procedures / Treatments   Labs (all labs ordered are  listed, but only abnormal results are displayed) Labs Reviewed  URINALYSIS, ROUTINE W REFLEX MICROSCOPIC - Abnormal; Notable for the following components:      Result Value   Specific Gravity, Urine 1.004 (*)    Ketones, ur 5 (*)    All other components within normal limits  BASIC METABOLIC PANEL - Abnormal; Notable for the following components:   Glucose, Bld 106 (*)    Calcium 8.7 (*)    All other components within normal limits  CBC WITH DIFFERENTIAL/PLATELET -  Abnormal; Notable for the following components:   WBC 3.5 (*)    RBC 5.18 (*)    Hemoglobin 15.5 (*)    HCT 46.8 (*)    All other components within normal limits  URINE CULTURE    EKG None  Radiology No results found.  Procedures Procedures (including critical care time)  Medications Ordered in ED Medications  sodium chloride 0.9 % bolus 1,000 mL (0 mLs Intravenous Stopped 09/14/20 1249)  ciprofloxacin (CIPRO) IVPB 400 mg (0 mg Intravenous Stopped 09/14/20 1218)    ED Course  I have reviewed the triage vital signs and the nursing notes.  Pertinent labs & imaging results that were available during my care of the patient were reviewed by me and considered in my medical decision making (see chart for details).    MDM Rules/Calculators/A&P                          Will obtain basic laboratory work-up including CBC, BMP, UA, and culture.  Her urine studies will be altered due to taking Macrobid the past couple days after being evaluated at an urgent care.  Antibiotic selection is limited due to her reported allergies of penicillins and sulfamethoxazole.  She states that she typically has received ciprofloxacin in the past, but has also may be received Keflex?  Unfortunately I cannot review medical notes from [facility] so we will err on the side of caution and treat her with ciprofloxacin IV and then discharge her home with an additional 3-day supply.  She can follow-up with her PCP for ongoing  evaluation and management.  While she endorsed loose stools previously, she tells me that it is only 1 episode of nonbloody loose stools per day.  I have low suspicion for Clostridium difficile or other infectious diarrhea.  She denies melena or hematochezia.  She is not grossly dehydrated on my exam.  She is hemodynamically stable and denies any fevers at home.  She also is actively drinking water in front of me and denies any difficulty eating and drinking.  While UA and urine culture are still pending, patient stated that she is feeling better and she would like to go home.  Her son is now at bedside.  I feel as though this is reasonable.  I do not feel as though UA will change my treatment given that I have low suspicion for pyelonephritis or obstructing ureterolithiasis at this time.  We will discharge her home with ciprofloxacin x3 days and encouraged her to follow-up with her primary care provider on outpatient basis.  Her symptoms should improve.  Her son states that she has also had some fatigue and diminished appetite, and in conjunction with her borderline leukopenia at 3.5 I offered COVID-19 testing, but patient declined.  She feels like she would like to go home as she has been in the ER for 3 hours and wants to leave.  ED return precautions discussed.  Patient voices understanding and is agreeable to plan.  Stephanie Valenzuela was evaluated in Emergency Department on 09/14/2020 for the symptoms described in the history of present illness. She was evaluated in the context of the global COVID-19 pandemic, which necessitated consideration that the patient might be at risk for infection with the SARS-CoV-2 virus that causes COVID-19. Institutional protocols and algorithms that pertain to the evaluation of patients at risk for COVID-19 are in a state of rapid change based on information released by regulatory bodies  including the CDC and federal and state organizations. These policies and algorithms were  followed during the patient's care in the ED.   Final Clinical Impression(s) / ED Diagnoses Final diagnoses:  Fatigue, unspecified type  Suprapubic discomfort  Urinary tract infection symptoms    Rx / DC Orders ED Discharge Orders         Ordered    ciprofloxacin (CIPRO) 500 MG tablet  2 times daily        09/14/20 1326           Corena Herter, PA-C 09/14/20 1327    Lucrezia Starch, MD 09/16/20 1143

## 2020-09-15 LAB — URINE CULTURE

## 2020-09-16 DIAGNOSIS — M9902 Segmental and somatic dysfunction of thoracic region: Secondary | ICD-10-CM | POA: Diagnosis not present

## 2020-09-16 DIAGNOSIS — M542 Cervicalgia: Secondary | ICD-10-CM | POA: Diagnosis not present

## 2020-09-16 DIAGNOSIS — M9901 Segmental and somatic dysfunction of cervical region: Secondary | ICD-10-CM | POA: Diagnosis not present

## 2020-09-16 DIAGNOSIS — M9903 Segmental and somatic dysfunction of lumbar region: Secondary | ICD-10-CM | POA: Diagnosis not present

## 2020-09-21 DIAGNOSIS — M9902 Segmental and somatic dysfunction of thoracic region: Secondary | ICD-10-CM | POA: Diagnosis not present

## 2020-09-21 DIAGNOSIS — M9903 Segmental and somatic dysfunction of lumbar region: Secondary | ICD-10-CM | POA: Diagnosis not present

## 2020-09-21 DIAGNOSIS — M9901 Segmental and somatic dysfunction of cervical region: Secondary | ICD-10-CM | POA: Diagnosis not present

## 2020-09-21 DIAGNOSIS — M542 Cervicalgia: Secondary | ICD-10-CM | POA: Diagnosis not present

## 2020-09-22 DIAGNOSIS — M9903 Segmental and somatic dysfunction of lumbar region: Secondary | ICD-10-CM | POA: Diagnosis not present

## 2020-09-22 DIAGNOSIS — M9902 Segmental and somatic dysfunction of thoracic region: Secondary | ICD-10-CM | POA: Diagnosis not present

## 2020-09-22 DIAGNOSIS — M542 Cervicalgia: Secondary | ICD-10-CM | POA: Diagnosis not present

## 2020-09-22 DIAGNOSIS — M9901 Segmental and somatic dysfunction of cervical region: Secondary | ICD-10-CM | POA: Diagnosis not present

## 2020-09-26 DIAGNOSIS — M9902 Segmental and somatic dysfunction of thoracic region: Secondary | ICD-10-CM | POA: Diagnosis not present

## 2020-09-26 DIAGNOSIS — M9903 Segmental and somatic dysfunction of lumbar region: Secondary | ICD-10-CM | POA: Diagnosis not present

## 2020-09-26 DIAGNOSIS — M542 Cervicalgia: Secondary | ICD-10-CM | POA: Diagnosis not present

## 2020-09-26 DIAGNOSIS — M9901 Segmental and somatic dysfunction of cervical region: Secondary | ICD-10-CM | POA: Diagnosis not present

## 2020-09-28 DIAGNOSIS — Z7689 Persons encountering health services in other specified circumstances: Secondary | ICD-10-CM | POA: Diagnosis not present

## 2020-09-28 DIAGNOSIS — R531 Weakness: Secondary | ICD-10-CM | POA: Diagnosis not present

## 2020-09-28 DIAGNOSIS — K219 Gastro-esophageal reflux disease without esophagitis: Secondary | ICD-10-CM | POA: Diagnosis not present

## 2020-09-28 DIAGNOSIS — N390 Urinary tract infection, site not specified: Secondary | ICD-10-CM | POA: Diagnosis not present

## 2020-09-30 DIAGNOSIS — M9901 Segmental and somatic dysfunction of cervical region: Secondary | ICD-10-CM | POA: Diagnosis not present

## 2020-09-30 DIAGNOSIS — M9902 Segmental and somatic dysfunction of thoracic region: Secondary | ICD-10-CM | POA: Diagnosis not present

## 2020-09-30 DIAGNOSIS — M9903 Segmental and somatic dysfunction of lumbar region: Secondary | ICD-10-CM | POA: Diagnosis not present

## 2020-09-30 DIAGNOSIS — M542 Cervicalgia: Secondary | ICD-10-CM | POA: Diagnosis not present

## 2020-10-04 DIAGNOSIS — M9901 Segmental and somatic dysfunction of cervical region: Secondary | ICD-10-CM | POA: Diagnosis not present

## 2020-10-04 DIAGNOSIS — M9902 Segmental and somatic dysfunction of thoracic region: Secondary | ICD-10-CM | POA: Diagnosis not present

## 2020-10-04 DIAGNOSIS — M9903 Segmental and somatic dysfunction of lumbar region: Secondary | ICD-10-CM | POA: Diagnosis not present

## 2020-10-04 DIAGNOSIS — M542 Cervicalgia: Secondary | ICD-10-CM | POA: Diagnosis not present

## 2020-10-21 DIAGNOSIS — M9901 Segmental and somatic dysfunction of cervical region: Secondary | ICD-10-CM | POA: Diagnosis not present

## 2020-10-21 DIAGNOSIS — M9903 Segmental and somatic dysfunction of lumbar region: Secondary | ICD-10-CM | POA: Diagnosis not present

## 2020-10-21 DIAGNOSIS — M542 Cervicalgia: Secondary | ICD-10-CM | POA: Diagnosis not present

## 2020-10-21 DIAGNOSIS — M9902 Segmental and somatic dysfunction of thoracic region: Secondary | ICD-10-CM | POA: Diagnosis not present

## 2020-10-27 DIAGNOSIS — M9901 Segmental and somatic dysfunction of cervical region: Secondary | ICD-10-CM | POA: Diagnosis not present

## 2020-10-27 DIAGNOSIS — M9903 Segmental and somatic dysfunction of lumbar region: Secondary | ICD-10-CM | POA: Diagnosis not present

## 2020-10-27 DIAGNOSIS — M9902 Segmental and somatic dysfunction of thoracic region: Secondary | ICD-10-CM | POA: Diagnosis not present

## 2020-10-27 DIAGNOSIS — M542 Cervicalgia: Secondary | ICD-10-CM | POA: Diagnosis not present

## 2020-11-02 DIAGNOSIS — M9903 Segmental and somatic dysfunction of lumbar region: Secondary | ICD-10-CM | POA: Diagnosis not present

## 2020-11-02 DIAGNOSIS — M542 Cervicalgia: Secondary | ICD-10-CM | POA: Diagnosis not present

## 2020-11-02 DIAGNOSIS — M9901 Segmental and somatic dysfunction of cervical region: Secondary | ICD-10-CM | POA: Diagnosis not present

## 2020-11-02 DIAGNOSIS — M9902 Segmental and somatic dysfunction of thoracic region: Secondary | ICD-10-CM | POA: Diagnosis not present

## 2020-11-09 DIAGNOSIS — M9901 Segmental and somatic dysfunction of cervical region: Secondary | ICD-10-CM | POA: Diagnosis not present

## 2020-11-09 DIAGNOSIS — M542 Cervicalgia: Secondary | ICD-10-CM | POA: Diagnosis not present

## 2020-11-09 DIAGNOSIS — M859 Disorder of bone density and structure, unspecified: Secondary | ICD-10-CM | POA: Diagnosis not present

## 2020-11-09 DIAGNOSIS — M9903 Segmental and somatic dysfunction of lumbar region: Secondary | ICD-10-CM | POA: Diagnosis not present

## 2020-11-09 DIAGNOSIS — M9902 Segmental and somatic dysfunction of thoracic region: Secondary | ICD-10-CM | POA: Diagnosis not present

## 2020-11-09 DIAGNOSIS — N1831 Chronic kidney disease, stage 3a: Secondary | ICD-10-CM | POA: Diagnosis not present

## 2020-11-16 DIAGNOSIS — E559 Vitamin D deficiency, unspecified: Secondary | ICD-10-CM | POA: Diagnosis not present

## 2020-11-16 DIAGNOSIS — N3090 Cystitis, unspecified without hematuria: Secondary | ICD-10-CM | POA: Diagnosis not present

## 2020-11-16 DIAGNOSIS — R82998 Other abnormal findings in urine: Secondary | ICD-10-CM | POA: Diagnosis not present

## 2020-11-16 DIAGNOSIS — Z1331 Encounter for screening for depression: Secondary | ICD-10-CM | POA: Diagnosis not present

## 2020-11-16 DIAGNOSIS — Z1339 Encounter for screening examination for other mental health and behavioral disorders: Secondary | ICD-10-CM | POA: Diagnosis not present

## 2020-11-16 DIAGNOSIS — G4700 Insomnia, unspecified: Secondary | ICD-10-CM | POA: Diagnosis not present

## 2020-11-16 DIAGNOSIS — N1831 Chronic kidney disease, stage 3a: Secondary | ICD-10-CM | POA: Diagnosis not present

## 2020-11-16 DIAGNOSIS — D692 Other nonthrombocytopenic purpura: Secondary | ICD-10-CM | POA: Diagnosis not present

## 2020-11-16 DIAGNOSIS — Z0000 Encounter for general adult medical examination without abnormal findings: Secondary | ICD-10-CM | POA: Diagnosis not present

## 2020-11-16 DIAGNOSIS — K219 Gastro-esophageal reflux disease without esophagitis: Secondary | ICD-10-CM | POA: Diagnosis not present

## 2020-11-17 DIAGNOSIS — I700 Atherosclerosis of aorta: Secondary | ICD-10-CM | POA: Diagnosis not present

## 2020-11-17 DIAGNOSIS — N3289 Other specified disorders of bladder: Secondary | ICD-10-CM | POA: Diagnosis not present

## 2020-11-17 DIAGNOSIS — N3020 Other chronic cystitis without hematuria: Secondary | ICD-10-CM | POA: Diagnosis not present

## 2020-11-17 DIAGNOSIS — R1084 Generalized abdominal pain: Secondary | ICD-10-CM | POA: Diagnosis not present

## 2020-11-17 DIAGNOSIS — K5750 Diverticulosis of both small and large intestine without perforation or abscess without bleeding: Secondary | ICD-10-CM | POA: Diagnosis not present

## 2020-11-17 DIAGNOSIS — R8271 Bacteriuria: Secondary | ICD-10-CM | POA: Diagnosis not present

## 2020-11-17 DIAGNOSIS — N1330 Unspecified hydronephrosis: Secondary | ICD-10-CM | POA: Diagnosis not present

## 2020-11-17 DIAGNOSIS — N139 Obstructive and reflux uropathy, unspecified: Secondary | ICD-10-CM | POA: Diagnosis not present

## 2020-11-25 ENCOUNTER — Other Ambulatory Visit: Payer: Self-pay | Admitting: Adult Health

## 2020-11-25 ENCOUNTER — Other Ambulatory Visit (HOSPITAL_COMMUNITY): Payer: Self-pay | Admitting: Adult Health

## 2020-11-25 DIAGNOSIS — N130 Hydronephrosis with ureteropelvic junction obstruction: Secondary | ICD-10-CM

## 2020-12-07 ENCOUNTER — Other Ambulatory Visit: Payer: Self-pay

## 2020-12-07 ENCOUNTER — Ambulatory Visit (HOSPITAL_COMMUNITY)
Admission: RE | Admit: 2020-12-07 | Discharge: 2020-12-07 | Disposition: A | Discharge status: Home / Self Care | Payer: Medicare HMO | Source: Ambulatory Visit | Attending: Adult Health | Admitting: Adult Health

## 2020-12-07 DIAGNOSIS — N130 Hydronephrosis with ureteropelvic junction obstruction: Secondary | ICD-10-CM | POA: Diagnosis not present

## 2020-12-07 DIAGNOSIS — N1330 Unspecified hydronephrosis: Secondary | ICD-10-CM | POA: Diagnosis not present

## 2020-12-07 DIAGNOSIS — N19 Unspecified kidney failure: Secondary | ICD-10-CM | POA: Diagnosis not present

## 2020-12-07 MED ORDER — FUROSEMIDE 10 MG/ML IJ SOLN
INTRAMUSCULAR | Status: AC
  Filled 2020-12-07: qty 8

## 2020-12-07 MED ORDER — FUROSEMIDE 10 MG/ML IJ SOLN
48.0000 mg | Freq: Once | INTRAMUSCULAR | Status: AC
  Administered 2020-12-07: 48 mg via INTRAVENOUS

## 2020-12-07 MED ORDER — TECHNETIUM TC 99M MERTIATIDE
5.3000 | Freq: Once | INTRAVENOUS | Status: AC
  Administered 2020-12-07: 5.3 via INTRAVENOUS

## 2020-12-30 DIAGNOSIS — Z6833 Body mass index (BMI) 33.0-33.9, adult: Secondary | ICD-10-CM | POA: Diagnosis not present

## 2020-12-30 DIAGNOSIS — Z01419 Encounter for gynecological examination (general) (routine) without abnormal findings: Secondary | ICD-10-CM | POA: Diagnosis not present

## 2020-12-30 DIAGNOSIS — N390 Urinary tract infection, site not specified: Secondary | ICD-10-CM | POA: Diagnosis not present

## 2021-01-11 DIAGNOSIS — N3946 Mixed incontinence: Secondary | ICD-10-CM | POA: Diagnosis not present

## 2021-01-11 DIAGNOSIS — R351 Nocturia: Secondary | ICD-10-CM | POA: Diagnosis not present

## 2021-01-11 DIAGNOSIS — N3020 Other chronic cystitis without hematuria: Secondary | ICD-10-CM | POA: Diagnosis not present

## 2021-02-17 DIAGNOSIS — Z1231 Encounter for screening mammogram for malignant neoplasm of breast: Secondary | ICD-10-CM | POA: Diagnosis not present

## 2021-04-17 DIAGNOSIS — M9902 Segmental and somatic dysfunction of thoracic region: Secondary | ICD-10-CM | POA: Diagnosis not present

## 2021-04-17 DIAGNOSIS — M9901 Segmental and somatic dysfunction of cervical region: Secondary | ICD-10-CM | POA: Diagnosis not present

## 2021-04-17 DIAGNOSIS — M9903 Segmental and somatic dysfunction of lumbar region: Secondary | ICD-10-CM | POA: Diagnosis not present

## 2021-04-17 DIAGNOSIS — M542 Cervicalgia: Secondary | ICD-10-CM | POA: Diagnosis not present

## 2021-04-19 DIAGNOSIS — M9902 Segmental and somatic dysfunction of thoracic region: Secondary | ICD-10-CM | POA: Diagnosis not present

## 2021-04-19 DIAGNOSIS — M9901 Segmental and somatic dysfunction of cervical region: Secondary | ICD-10-CM | POA: Diagnosis not present

## 2021-04-19 DIAGNOSIS — M542 Cervicalgia: Secondary | ICD-10-CM | POA: Diagnosis not present

## 2021-04-19 DIAGNOSIS — M9903 Segmental and somatic dysfunction of lumbar region: Secondary | ICD-10-CM | POA: Diagnosis not present

## 2021-04-24 DIAGNOSIS — M9902 Segmental and somatic dysfunction of thoracic region: Secondary | ICD-10-CM | POA: Diagnosis not present

## 2021-04-24 DIAGNOSIS — M9901 Segmental and somatic dysfunction of cervical region: Secondary | ICD-10-CM | POA: Diagnosis not present

## 2021-04-24 DIAGNOSIS — M542 Cervicalgia: Secondary | ICD-10-CM | POA: Diagnosis not present

## 2021-04-24 DIAGNOSIS — M9903 Segmental and somatic dysfunction of lumbar region: Secondary | ICD-10-CM | POA: Diagnosis not present

## 2021-04-28 DIAGNOSIS — M542 Cervicalgia: Secondary | ICD-10-CM | POA: Diagnosis not present

## 2021-04-28 DIAGNOSIS — M9903 Segmental and somatic dysfunction of lumbar region: Secondary | ICD-10-CM | POA: Diagnosis not present

## 2021-04-28 DIAGNOSIS — M9901 Segmental and somatic dysfunction of cervical region: Secondary | ICD-10-CM | POA: Diagnosis not present

## 2021-04-28 DIAGNOSIS — M9902 Segmental and somatic dysfunction of thoracic region: Secondary | ICD-10-CM | POA: Diagnosis not present

## 2021-05-03 DIAGNOSIS — M9903 Segmental and somatic dysfunction of lumbar region: Secondary | ICD-10-CM | POA: Diagnosis not present

## 2021-05-03 DIAGNOSIS — M9902 Segmental and somatic dysfunction of thoracic region: Secondary | ICD-10-CM | POA: Diagnosis not present

## 2021-05-03 DIAGNOSIS — M9901 Segmental and somatic dysfunction of cervical region: Secondary | ICD-10-CM | POA: Diagnosis not present

## 2021-05-03 DIAGNOSIS — M542 Cervicalgia: Secondary | ICD-10-CM | POA: Diagnosis not present

## 2021-05-10 DIAGNOSIS — M9902 Segmental and somatic dysfunction of thoracic region: Secondary | ICD-10-CM | POA: Diagnosis not present

## 2021-05-10 DIAGNOSIS — M542 Cervicalgia: Secondary | ICD-10-CM | POA: Diagnosis not present

## 2021-05-10 DIAGNOSIS — M9903 Segmental and somatic dysfunction of lumbar region: Secondary | ICD-10-CM | POA: Diagnosis not present

## 2021-05-10 DIAGNOSIS — M9901 Segmental and somatic dysfunction of cervical region: Secondary | ICD-10-CM | POA: Diagnosis not present

## 2021-05-17 DIAGNOSIS — M9901 Segmental and somatic dysfunction of cervical region: Secondary | ICD-10-CM | POA: Diagnosis not present

## 2021-05-17 DIAGNOSIS — M9902 Segmental and somatic dysfunction of thoracic region: Secondary | ICD-10-CM | POA: Diagnosis not present

## 2021-05-17 DIAGNOSIS — M9903 Segmental and somatic dysfunction of lumbar region: Secondary | ICD-10-CM | POA: Diagnosis not present

## 2021-05-17 DIAGNOSIS — M542 Cervicalgia: Secondary | ICD-10-CM | POA: Diagnosis not present

## 2021-05-29 DIAGNOSIS — Q6211 Congenital occlusion of ureteropelvic junction: Secondary | ICD-10-CM | POA: Diagnosis not present

## 2021-05-29 DIAGNOSIS — N3020 Other chronic cystitis without hematuria: Secondary | ICD-10-CM | POA: Diagnosis not present

## 2021-05-29 DIAGNOSIS — R1084 Generalized abdominal pain: Secondary | ICD-10-CM | POA: Diagnosis not present

## 2021-05-29 DIAGNOSIS — N3946 Mixed incontinence: Secondary | ICD-10-CM | POA: Diagnosis not present

## 2021-05-29 DIAGNOSIS — R3915 Urgency of urination: Secondary | ICD-10-CM | POA: Diagnosis not present

## 2021-05-31 DIAGNOSIS — R112 Nausea with vomiting, unspecified: Secondary | ICD-10-CM | POA: Diagnosis not present

## 2021-05-31 DIAGNOSIS — R1084 Generalized abdominal pain: Secondary | ICD-10-CM | POA: Diagnosis not present

## 2021-06-12 DIAGNOSIS — N3020 Other chronic cystitis without hematuria: Secondary | ICD-10-CM | POA: Diagnosis not present

## 2021-06-26 DIAGNOSIS — H25812 Combined forms of age-related cataract, left eye: Secondary | ICD-10-CM | POA: Diagnosis not present

## 2021-06-26 DIAGNOSIS — H25811 Combined forms of age-related cataract, right eye: Secondary | ICD-10-CM | POA: Diagnosis not present

## 2021-06-26 DIAGNOSIS — H353130 Nonexudative age-related macular degeneration, bilateral, stage unspecified: Secondary | ICD-10-CM | POA: Diagnosis not present

## 2021-06-26 DIAGNOSIS — H5203 Hypermetropia, bilateral: Secondary | ICD-10-CM | POA: Diagnosis not present

## 2021-06-28 DIAGNOSIS — M549 Dorsalgia, unspecified: Secondary | ICD-10-CM | POA: Diagnosis not present

## 2021-06-28 DIAGNOSIS — R109 Unspecified abdominal pain: Secondary | ICD-10-CM | POA: Diagnosis not present

## 2021-07-04 DIAGNOSIS — N139 Obstructive and reflux uropathy, unspecified: Secondary | ICD-10-CM | POA: Diagnosis not present

## 2021-07-11 DIAGNOSIS — M9901 Segmental and somatic dysfunction of cervical region: Secondary | ICD-10-CM | POA: Diagnosis not present

## 2021-07-11 DIAGNOSIS — M9902 Segmental and somatic dysfunction of thoracic region: Secondary | ICD-10-CM | POA: Diagnosis not present

## 2021-07-11 DIAGNOSIS — M9903 Segmental and somatic dysfunction of lumbar region: Secondary | ICD-10-CM | POA: Diagnosis not present

## 2021-07-11 DIAGNOSIS — M542 Cervicalgia: Secondary | ICD-10-CM | POA: Diagnosis not present

## 2021-07-11 DIAGNOSIS — R109 Unspecified abdominal pain: Secondary | ICD-10-CM | POA: Diagnosis not present

## 2021-07-11 DIAGNOSIS — R1084 Generalized abdominal pain: Secondary | ICD-10-CM | POA: Diagnosis not present

## 2021-07-13 DIAGNOSIS — M542 Cervicalgia: Secondary | ICD-10-CM | POA: Diagnosis not present

## 2021-07-13 DIAGNOSIS — M9902 Segmental and somatic dysfunction of thoracic region: Secondary | ICD-10-CM | POA: Diagnosis not present

## 2021-07-13 DIAGNOSIS — M9901 Segmental and somatic dysfunction of cervical region: Secondary | ICD-10-CM | POA: Diagnosis not present

## 2021-07-13 DIAGNOSIS — M9903 Segmental and somatic dysfunction of lumbar region: Secondary | ICD-10-CM | POA: Diagnosis not present

## 2021-07-17 DIAGNOSIS — M9903 Segmental and somatic dysfunction of lumbar region: Secondary | ICD-10-CM | POA: Diagnosis not present

## 2021-07-17 DIAGNOSIS — M9901 Segmental and somatic dysfunction of cervical region: Secondary | ICD-10-CM | POA: Diagnosis not present

## 2021-07-17 DIAGNOSIS — M542 Cervicalgia: Secondary | ICD-10-CM | POA: Diagnosis not present

## 2021-07-17 DIAGNOSIS — M9902 Segmental and somatic dysfunction of thoracic region: Secondary | ICD-10-CM | POA: Diagnosis not present

## 2021-07-20 DIAGNOSIS — M542 Cervicalgia: Secondary | ICD-10-CM | POA: Diagnosis not present

## 2021-07-20 DIAGNOSIS — M9901 Segmental and somatic dysfunction of cervical region: Secondary | ICD-10-CM | POA: Diagnosis not present

## 2021-07-20 DIAGNOSIS — M9902 Segmental and somatic dysfunction of thoracic region: Secondary | ICD-10-CM | POA: Diagnosis not present

## 2021-07-20 DIAGNOSIS — M9903 Segmental and somatic dysfunction of lumbar region: Secondary | ICD-10-CM | POA: Diagnosis not present

## 2021-07-31 DIAGNOSIS — M9901 Segmental and somatic dysfunction of cervical region: Secondary | ICD-10-CM | POA: Diagnosis not present

## 2021-07-31 DIAGNOSIS — M9902 Segmental and somatic dysfunction of thoracic region: Secondary | ICD-10-CM | POA: Diagnosis not present

## 2021-07-31 DIAGNOSIS — M542 Cervicalgia: Secondary | ICD-10-CM | POA: Diagnosis not present

## 2021-07-31 DIAGNOSIS — M9903 Segmental and somatic dysfunction of lumbar region: Secondary | ICD-10-CM | POA: Diagnosis not present

## 2021-09-19 DIAGNOSIS — M9901 Segmental and somatic dysfunction of cervical region: Secondary | ICD-10-CM | POA: Diagnosis not present

## 2021-09-19 DIAGNOSIS — M9903 Segmental and somatic dysfunction of lumbar region: Secondary | ICD-10-CM | POA: Diagnosis not present

## 2021-09-19 DIAGNOSIS — M542 Cervicalgia: Secondary | ICD-10-CM | POA: Diagnosis not present

## 2021-09-19 DIAGNOSIS — M5136 Other intervertebral disc degeneration, lumbar region: Secondary | ICD-10-CM | POA: Diagnosis not present

## 2021-09-22 DIAGNOSIS — M5136 Other intervertebral disc degeneration, lumbar region: Secondary | ICD-10-CM | POA: Diagnosis not present

## 2021-09-22 DIAGNOSIS — M9901 Segmental and somatic dysfunction of cervical region: Secondary | ICD-10-CM | POA: Diagnosis not present

## 2021-09-22 DIAGNOSIS — M9903 Segmental and somatic dysfunction of lumbar region: Secondary | ICD-10-CM | POA: Diagnosis not present

## 2021-09-22 DIAGNOSIS — M542 Cervicalgia: Secondary | ICD-10-CM | POA: Diagnosis not present

## 2021-09-29 DIAGNOSIS — M9903 Segmental and somatic dysfunction of lumbar region: Secondary | ICD-10-CM | POA: Diagnosis not present

## 2021-09-29 DIAGNOSIS — M9901 Segmental and somatic dysfunction of cervical region: Secondary | ICD-10-CM | POA: Diagnosis not present

## 2021-09-29 DIAGNOSIS — M542 Cervicalgia: Secondary | ICD-10-CM | POA: Diagnosis not present

## 2021-09-29 DIAGNOSIS — M5136 Other intervertebral disc degeneration, lumbar region: Secondary | ICD-10-CM | POA: Diagnosis not present

## 2021-10-06 DIAGNOSIS — M542 Cervicalgia: Secondary | ICD-10-CM | POA: Diagnosis not present

## 2021-10-06 DIAGNOSIS — M9903 Segmental and somatic dysfunction of lumbar region: Secondary | ICD-10-CM | POA: Diagnosis not present

## 2021-10-06 DIAGNOSIS — M5136 Other intervertebral disc degeneration, lumbar region: Secondary | ICD-10-CM | POA: Diagnosis not present

## 2021-10-06 DIAGNOSIS — M9901 Segmental and somatic dysfunction of cervical region: Secondary | ICD-10-CM | POA: Diagnosis not present

## 2021-10-13 DIAGNOSIS — M5136 Other intervertebral disc degeneration, lumbar region: Secondary | ICD-10-CM | POA: Diagnosis not present

## 2021-10-13 DIAGNOSIS — M9901 Segmental and somatic dysfunction of cervical region: Secondary | ICD-10-CM | POA: Diagnosis not present

## 2021-10-13 DIAGNOSIS — M542 Cervicalgia: Secondary | ICD-10-CM | POA: Diagnosis not present

## 2021-10-13 DIAGNOSIS — M9903 Segmental and somatic dysfunction of lumbar region: Secondary | ICD-10-CM | POA: Diagnosis not present

## 2021-10-20 DIAGNOSIS — M9901 Segmental and somatic dysfunction of cervical region: Secondary | ICD-10-CM | POA: Diagnosis not present

## 2021-10-20 DIAGNOSIS — M5136 Other intervertebral disc degeneration, lumbar region: Secondary | ICD-10-CM | POA: Diagnosis not present

## 2021-10-20 DIAGNOSIS — M542 Cervicalgia: Secondary | ICD-10-CM | POA: Diagnosis not present

## 2021-10-20 DIAGNOSIS — M9903 Segmental and somatic dysfunction of lumbar region: Secondary | ICD-10-CM | POA: Diagnosis not present

## 2021-10-25 DIAGNOSIS — M5136 Other intervertebral disc degeneration, lumbar region: Secondary | ICD-10-CM | POA: Diagnosis not present

## 2021-10-25 DIAGNOSIS — M9903 Segmental and somatic dysfunction of lumbar region: Secondary | ICD-10-CM | POA: Diagnosis not present

## 2021-10-25 DIAGNOSIS — M9901 Segmental and somatic dysfunction of cervical region: Secondary | ICD-10-CM | POA: Diagnosis not present

## 2021-10-25 DIAGNOSIS — M542 Cervicalgia: Secondary | ICD-10-CM | POA: Diagnosis not present

## 2021-12-06 DIAGNOSIS — N3946 Mixed incontinence: Secondary | ICD-10-CM | POA: Diagnosis not present

## 2021-12-06 DIAGNOSIS — Q6211 Congenital occlusion of ureteropelvic junction: Secondary | ICD-10-CM | POA: Diagnosis not present

## 2022-01-03 DIAGNOSIS — M546 Pain in thoracic spine: Secondary | ICD-10-CM | POA: Diagnosis not present

## 2022-01-03 DIAGNOSIS — M50322 Other cervical disc degeneration at C5-C6 level: Secondary | ICD-10-CM | POA: Diagnosis not present

## 2022-01-03 DIAGNOSIS — M9902 Segmental and somatic dysfunction of thoracic region: Secondary | ICD-10-CM | POA: Diagnosis not present

## 2022-01-03 DIAGNOSIS — M9901 Segmental and somatic dysfunction of cervical region: Secondary | ICD-10-CM | POA: Diagnosis not present

## 2022-01-08 DIAGNOSIS — M50322 Other cervical disc degeneration at C5-C6 level: Secondary | ICD-10-CM | POA: Diagnosis not present

## 2022-01-08 DIAGNOSIS — M546 Pain in thoracic spine: Secondary | ICD-10-CM | POA: Diagnosis not present

## 2022-01-08 DIAGNOSIS — M9902 Segmental and somatic dysfunction of thoracic region: Secondary | ICD-10-CM | POA: Diagnosis not present

## 2022-01-08 DIAGNOSIS — M9901 Segmental and somatic dysfunction of cervical region: Secondary | ICD-10-CM | POA: Diagnosis not present

## 2022-01-09 DIAGNOSIS — R7309 Other abnormal glucose: Secondary | ICD-10-CM | POA: Diagnosis not present

## 2022-01-09 DIAGNOSIS — Z01419 Encounter for gynecological examination (general) (routine) without abnormal findings: Secondary | ICD-10-CM | POA: Diagnosis not present

## 2022-01-09 DIAGNOSIS — R309 Painful micturition, unspecified: Secondary | ICD-10-CM | POA: Diagnosis not present

## 2022-01-09 DIAGNOSIS — Z760 Encounter for issue of repeat prescription: Secondary | ICD-10-CM | POA: Diagnosis not present

## 2022-01-09 DIAGNOSIS — Z1329 Encounter for screening for other suspected endocrine disorder: Secondary | ICD-10-CM | POA: Diagnosis not present

## 2022-01-09 DIAGNOSIS — Z1272 Encounter for screening for malignant neoplasm of vagina: Secondary | ICD-10-CM | POA: Diagnosis not present

## 2022-01-09 DIAGNOSIS — Z13220 Encounter for screening for lipoid disorders: Secondary | ICD-10-CM | POA: Diagnosis not present

## 2022-01-09 DIAGNOSIS — Z136 Encounter for screening for cardiovascular disorders: Secondary | ICD-10-CM | POA: Diagnosis not present

## 2022-01-09 DIAGNOSIS — R799 Abnormal finding of blood chemistry, unspecified: Secondary | ICD-10-CM | POA: Diagnosis not present

## 2022-01-09 DIAGNOSIS — K219 Gastro-esophageal reflux disease without esophagitis: Secondary | ICD-10-CM | POA: Diagnosis not present

## 2022-01-11 ENCOUNTER — Other Ambulatory Visit (HOSPITAL_COMMUNITY): Payer: Self-pay | Admitting: Obstetrics and Gynecology

## 2022-01-11 DIAGNOSIS — Z136 Encounter for screening for cardiovascular disorders: Secondary | ICD-10-CM

## 2022-01-16 DIAGNOSIS — M9901 Segmental and somatic dysfunction of cervical region: Secondary | ICD-10-CM | POA: Diagnosis not present

## 2022-01-16 DIAGNOSIS — M546 Pain in thoracic spine: Secondary | ICD-10-CM | POA: Diagnosis not present

## 2022-01-16 DIAGNOSIS — M9902 Segmental and somatic dysfunction of thoracic region: Secondary | ICD-10-CM | POA: Diagnosis not present

## 2022-01-16 DIAGNOSIS — M50322 Other cervical disc degeneration at C5-C6 level: Secondary | ICD-10-CM | POA: Diagnosis not present

## 2022-01-17 ENCOUNTER — Other Ambulatory Visit (HOSPITAL_COMMUNITY): Payer: Medicare HMO

## 2022-02-06 DIAGNOSIS — M546 Pain in thoracic spine: Secondary | ICD-10-CM | POA: Diagnosis not present

## 2022-02-06 DIAGNOSIS — M50322 Other cervical disc degeneration at C5-C6 level: Secondary | ICD-10-CM | POA: Diagnosis not present

## 2022-02-06 DIAGNOSIS — M9902 Segmental and somatic dysfunction of thoracic region: Secondary | ICD-10-CM | POA: Diagnosis not present

## 2022-02-06 DIAGNOSIS — M9901 Segmental and somatic dysfunction of cervical region: Secondary | ICD-10-CM | POA: Diagnosis not present

## 2022-02-09 DIAGNOSIS — J309 Allergic rhinitis, unspecified: Secondary | ICD-10-CM | POA: Diagnosis not present

## 2022-02-13 DIAGNOSIS — M50322 Other cervical disc degeneration at C5-C6 level: Secondary | ICD-10-CM | POA: Diagnosis not present

## 2022-02-13 DIAGNOSIS — M546 Pain in thoracic spine: Secondary | ICD-10-CM | POA: Diagnosis not present

## 2022-02-13 DIAGNOSIS — M9901 Segmental and somatic dysfunction of cervical region: Secondary | ICD-10-CM | POA: Diagnosis not present

## 2022-02-13 DIAGNOSIS — M9902 Segmental and somatic dysfunction of thoracic region: Secondary | ICD-10-CM | POA: Diagnosis not present

## 2022-03-01 DIAGNOSIS — M9902 Segmental and somatic dysfunction of thoracic region: Secondary | ICD-10-CM | POA: Diagnosis not present

## 2022-03-01 DIAGNOSIS — M9901 Segmental and somatic dysfunction of cervical region: Secondary | ICD-10-CM | POA: Diagnosis not present

## 2022-03-01 DIAGNOSIS — M50322 Other cervical disc degeneration at C5-C6 level: Secondary | ICD-10-CM | POA: Diagnosis not present

## 2022-03-01 DIAGNOSIS — M546 Pain in thoracic spine: Secondary | ICD-10-CM | POA: Diagnosis not present

## 2022-03-14 DIAGNOSIS — N3946 Mixed incontinence: Secondary | ICD-10-CM | POA: Diagnosis not present

## 2022-03-14 DIAGNOSIS — N3020 Other chronic cystitis without hematuria: Secondary | ICD-10-CM | POA: Diagnosis not present

## 2022-05-04 DIAGNOSIS — M5459 Other low back pain: Secondary | ICD-10-CM | POA: Diagnosis not present

## 2022-05-04 DIAGNOSIS — M546 Pain in thoracic spine: Secondary | ICD-10-CM | POA: Diagnosis not present

## 2022-05-04 DIAGNOSIS — M9903 Segmental and somatic dysfunction of lumbar region: Secondary | ICD-10-CM | POA: Diagnosis not present

## 2022-05-04 DIAGNOSIS — M9902 Segmental and somatic dysfunction of thoracic region: Secondary | ICD-10-CM | POA: Diagnosis not present

## 2022-05-11 DIAGNOSIS — M5459 Other low back pain: Secondary | ICD-10-CM | POA: Diagnosis not present

## 2022-05-11 DIAGNOSIS — M546 Pain in thoracic spine: Secondary | ICD-10-CM | POA: Diagnosis not present

## 2022-05-11 DIAGNOSIS — M9903 Segmental and somatic dysfunction of lumbar region: Secondary | ICD-10-CM | POA: Diagnosis not present

## 2022-05-11 DIAGNOSIS — M9902 Segmental and somatic dysfunction of thoracic region: Secondary | ICD-10-CM | POA: Diagnosis not present

## 2022-05-16 DIAGNOSIS — M546 Pain in thoracic spine: Secondary | ICD-10-CM | POA: Diagnosis not present

## 2022-05-16 DIAGNOSIS — M9902 Segmental and somatic dysfunction of thoracic region: Secondary | ICD-10-CM | POA: Diagnosis not present

## 2022-05-16 DIAGNOSIS — M9903 Segmental and somatic dysfunction of lumbar region: Secondary | ICD-10-CM | POA: Diagnosis not present

## 2022-05-16 DIAGNOSIS — M5459 Other low back pain: Secondary | ICD-10-CM | POA: Diagnosis not present

## 2022-05-25 DIAGNOSIS — M546 Pain in thoracic spine: Secondary | ICD-10-CM | POA: Diagnosis not present

## 2022-05-25 DIAGNOSIS — M9902 Segmental and somatic dysfunction of thoracic region: Secondary | ICD-10-CM | POA: Diagnosis not present

## 2022-05-25 DIAGNOSIS — M9903 Segmental and somatic dysfunction of lumbar region: Secondary | ICD-10-CM | POA: Diagnosis not present

## 2022-05-25 DIAGNOSIS — M5459 Other low back pain: Secondary | ICD-10-CM | POA: Diagnosis not present

## 2022-06-01 DIAGNOSIS — M5459 Other low back pain: Secondary | ICD-10-CM | POA: Diagnosis not present

## 2022-06-01 DIAGNOSIS — M9903 Segmental and somatic dysfunction of lumbar region: Secondary | ICD-10-CM | POA: Diagnosis not present

## 2022-06-01 DIAGNOSIS — M9902 Segmental and somatic dysfunction of thoracic region: Secondary | ICD-10-CM | POA: Diagnosis not present

## 2022-06-01 DIAGNOSIS — M546 Pain in thoracic spine: Secondary | ICD-10-CM | POA: Diagnosis not present

## 2022-06-26 DIAGNOSIS — I872 Venous insufficiency (chronic) (peripheral): Secondary | ICD-10-CM | POA: Diagnosis not present

## 2022-06-26 DIAGNOSIS — R600 Localized edema: Secondary | ICD-10-CM | POA: Diagnosis not present

## 2022-08-03 DIAGNOSIS — M9902 Segmental and somatic dysfunction of thoracic region: Secondary | ICD-10-CM | POA: Diagnosis not present

## 2022-08-03 DIAGNOSIS — M9903 Segmental and somatic dysfunction of lumbar region: Secondary | ICD-10-CM | POA: Diagnosis not present

## 2022-08-03 DIAGNOSIS — M546 Pain in thoracic spine: Secondary | ICD-10-CM | POA: Diagnosis not present

## 2022-08-03 DIAGNOSIS — M5459 Other low back pain: Secondary | ICD-10-CM | POA: Diagnosis not present

## 2022-08-06 DIAGNOSIS — M9902 Segmental and somatic dysfunction of thoracic region: Secondary | ICD-10-CM | POA: Diagnosis not present

## 2022-08-06 DIAGNOSIS — M546 Pain in thoracic spine: Secondary | ICD-10-CM | POA: Diagnosis not present

## 2022-08-06 DIAGNOSIS — M5459 Other low back pain: Secondary | ICD-10-CM | POA: Diagnosis not present

## 2022-08-06 DIAGNOSIS — M9903 Segmental and somatic dysfunction of lumbar region: Secondary | ICD-10-CM | POA: Diagnosis not present

## 2022-08-07 DIAGNOSIS — R69 Illness, unspecified: Secondary | ICD-10-CM | POA: Diagnosis not present

## 2022-08-07 DIAGNOSIS — R21 Rash and other nonspecific skin eruption: Secondary | ICD-10-CM | POA: Diagnosis not present

## 2022-08-07 DIAGNOSIS — B029 Zoster without complications: Secondary | ICD-10-CM | POA: Diagnosis not present

## 2022-08-10 DIAGNOSIS — M5459 Other low back pain: Secondary | ICD-10-CM | POA: Diagnosis not present

## 2022-08-10 DIAGNOSIS — M546 Pain in thoracic spine: Secondary | ICD-10-CM | POA: Diagnosis not present

## 2022-08-10 DIAGNOSIS — M9903 Segmental and somatic dysfunction of lumbar region: Secondary | ICD-10-CM | POA: Diagnosis not present

## 2022-08-10 DIAGNOSIS — M9902 Segmental and somatic dysfunction of thoracic region: Secondary | ICD-10-CM | POA: Diagnosis not present

## 2022-08-13 DIAGNOSIS — M9902 Segmental and somatic dysfunction of thoracic region: Secondary | ICD-10-CM | POA: Diagnosis not present

## 2022-08-13 DIAGNOSIS — M5459 Other low back pain: Secondary | ICD-10-CM | POA: Diagnosis not present

## 2022-08-13 DIAGNOSIS — M546 Pain in thoracic spine: Secondary | ICD-10-CM | POA: Diagnosis not present

## 2022-08-13 DIAGNOSIS — M9903 Segmental and somatic dysfunction of lumbar region: Secondary | ICD-10-CM | POA: Diagnosis not present

## 2022-08-14 DIAGNOSIS — L814 Other melanin hyperpigmentation: Secondary | ICD-10-CM | POA: Diagnosis not present

## 2022-08-14 DIAGNOSIS — D692 Other nonthrombocytopenic purpura: Secondary | ICD-10-CM | POA: Diagnosis not present

## 2022-08-14 DIAGNOSIS — D235 Other benign neoplasm of skin of trunk: Secondary | ICD-10-CM | POA: Diagnosis not present

## 2022-08-14 DIAGNOSIS — L570 Actinic keratosis: Secondary | ICD-10-CM | POA: Diagnosis not present

## 2022-08-14 DIAGNOSIS — L303 Infective dermatitis: Secondary | ICD-10-CM | POA: Diagnosis not present

## 2022-08-14 DIAGNOSIS — L821 Other seborrheic keratosis: Secondary | ICD-10-CM | POA: Diagnosis not present

## 2022-08-14 DIAGNOSIS — L579 Skin changes due to chronic exposure to nonionizing radiation, unspecified: Secondary | ICD-10-CM | POA: Diagnosis not present

## 2022-08-15 DIAGNOSIS — H35443 Age-related reticular degeneration of retina, bilateral: Secondary | ICD-10-CM | POA: Diagnosis not present

## 2022-08-15 DIAGNOSIS — H5203 Hypermetropia, bilateral: Secondary | ICD-10-CM | POA: Diagnosis not present

## 2022-08-15 DIAGNOSIS — H2513 Age-related nuclear cataract, bilateral: Secondary | ICD-10-CM | POA: Diagnosis not present

## 2022-08-15 DIAGNOSIS — H02051 Trichiasis without entropian right upper eyelid: Secondary | ICD-10-CM | POA: Diagnosis not present

## 2022-08-15 DIAGNOSIS — H353131 Nonexudative age-related macular degeneration, bilateral, early dry stage: Secondary | ICD-10-CM | POA: Diagnosis not present

## 2022-08-15 DIAGNOSIS — H524 Presbyopia: Secondary | ICD-10-CM | POA: Diagnosis not present

## 2022-08-17 DIAGNOSIS — M5459 Other low back pain: Secondary | ICD-10-CM | POA: Diagnosis not present

## 2022-08-17 DIAGNOSIS — M9902 Segmental and somatic dysfunction of thoracic region: Secondary | ICD-10-CM | POA: Diagnosis not present

## 2022-08-17 DIAGNOSIS — M9903 Segmental and somatic dysfunction of lumbar region: Secondary | ICD-10-CM | POA: Diagnosis not present

## 2022-08-17 DIAGNOSIS — M546 Pain in thoracic spine: Secondary | ICD-10-CM | POA: Diagnosis not present

## 2022-08-20 DIAGNOSIS — M9903 Segmental and somatic dysfunction of lumbar region: Secondary | ICD-10-CM | POA: Diagnosis not present

## 2022-08-20 DIAGNOSIS — M9902 Segmental and somatic dysfunction of thoracic region: Secondary | ICD-10-CM | POA: Diagnosis not present

## 2022-08-20 DIAGNOSIS — M546 Pain in thoracic spine: Secondary | ICD-10-CM | POA: Diagnosis not present

## 2022-08-20 DIAGNOSIS — M5459 Other low back pain: Secondary | ICD-10-CM | POA: Diagnosis not present

## 2022-08-21 DIAGNOSIS — M5451 Vertebrogenic low back pain: Secondary | ICD-10-CM | POA: Diagnosis not present

## 2022-08-24 DIAGNOSIS — M5451 Vertebrogenic low back pain: Secondary | ICD-10-CM | POA: Diagnosis not present

## 2022-09-04 DIAGNOSIS — H2513 Age-related nuclear cataract, bilateral: Secondary | ICD-10-CM | POA: Diagnosis not present

## 2022-09-04 DIAGNOSIS — H353131 Nonexudative age-related macular degeneration, bilateral, early dry stage: Secondary | ICD-10-CM | POA: Diagnosis not present

## 2022-09-04 DIAGNOSIS — H35443 Age-related reticular degeneration of retina, bilateral: Secondary | ICD-10-CM | POA: Diagnosis not present

## 2022-09-04 DIAGNOSIS — H02051 Trichiasis without entropian right upper eyelid: Secondary | ICD-10-CM | POA: Diagnosis not present

## 2022-09-10 DIAGNOSIS — M5451 Vertebrogenic low back pain: Secondary | ICD-10-CM | POA: Diagnosis not present

## 2022-09-18 DIAGNOSIS — H25813 Combined forms of age-related cataract, bilateral: Secondary | ICD-10-CM | POA: Diagnosis not present

## 2022-09-18 DIAGNOSIS — H02834 Dermatochalasis of left upper eyelid: Secondary | ICD-10-CM | POA: Diagnosis not present

## 2022-09-18 DIAGNOSIS — H04123 Dry eye syndrome of bilateral lacrimal glands: Secondary | ICD-10-CM | POA: Diagnosis not present

## 2022-09-18 DIAGNOSIS — H5203 Hypermetropia, bilateral: Secondary | ICD-10-CM | POA: Diagnosis not present

## 2022-09-18 DIAGNOSIS — H353132 Nonexudative age-related macular degeneration, bilateral, intermediate dry stage: Secondary | ICD-10-CM | POA: Diagnosis not present

## 2022-09-18 DIAGNOSIS — H02831 Dermatochalasis of right upper eyelid: Secondary | ICD-10-CM | POA: Diagnosis not present

## 2022-10-09 DIAGNOSIS — H02834 Dermatochalasis of left upper eyelid: Secondary | ICD-10-CM | POA: Diagnosis not present

## 2022-10-09 DIAGNOSIS — H353132 Nonexudative age-related macular degeneration, bilateral, intermediate dry stage: Secondary | ICD-10-CM | POA: Diagnosis not present

## 2022-10-09 DIAGNOSIS — H02831 Dermatochalasis of right upper eyelid: Secondary | ICD-10-CM | POA: Diagnosis not present

## 2022-10-09 DIAGNOSIS — H04123 Dry eye syndrome of bilateral lacrimal glands: Secondary | ICD-10-CM | POA: Diagnosis not present

## 2022-10-09 DIAGNOSIS — H52223 Regular astigmatism, bilateral: Secondary | ICD-10-CM | POA: Diagnosis not present

## 2022-10-09 DIAGNOSIS — H25813 Combined forms of age-related cataract, bilateral: Secondary | ICD-10-CM | POA: Diagnosis not present

## 2022-10-22 DIAGNOSIS — M9902 Segmental and somatic dysfunction of thoracic region: Secondary | ICD-10-CM | POA: Diagnosis not present

## 2022-10-22 DIAGNOSIS — M546 Pain in thoracic spine: Secondary | ICD-10-CM | POA: Diagnosis not present

## 2022-10-22 DIAGNOSIS — M9901 Segmental and somatic dysfunction of cervical region: Secondary | ICD-10-CM | POA: Diagnosis not present

## 2022-10-22 DIAGNOSIS — M50122 Cervical disc disorder at C5-C6 level with radiculopathy: Secondary | ICD-10-CM | POA: Diagnosis not present

## 2022-10-25 DIAGNOSIS — M5451 Vertebrogenic low back pain: Secondary | ICD-10-CM | POA: Diagnosis not present

## 2022-12-10 DIAGNOSIS — E785 Hyperlipidemia, unspecified: Secondary | ICD-10-CM | POA: Diagnosis not present

## 2022-12-10 DIAGNOSIS — R5383 Other fatigue: Secondary | ICD-10-CM | POA: Diagnosis not present

## 2022-12-10 DIAGNOSIS — N390 Urinary tract infection, site not specified: Secondary | ICD-10-CM | POA: Diagnosis not present

## 2022-12-10 DIAGNOSIS — Z7189 Other specified counseling: Secondary | ICD-10-CM | POA: Diagnosis not present

## 2022-12-10 DIAGNOSIS — E559 Vitamin D deficiency, unspecified: Secondary | ICD-10-CM | POA: Diagnosis not present

## 2022-12-10 DIAGNOSIS — R030 Elevated blood-pressure reading, without diagnosis of hypertension: Secondary | ICD-10-CM | POA: Diagnosis not present

## 2022-12-31 DIAGNOSIS — N390 Urinary tract infection, site not specified: Secondary | ICD-10-CM | POA: Diagnosis not present

## 2022-12-31 DIAGNOSIS — J3089 Other allergic rhinitis: Secondary | ICD-10-CM | POA: Diagnosis not present

## 2022-12-31 DIAGNOSIS — R718 Other abnormality of red blood cells: Secondary | ICD-10-CM | POA: Diagnosis not present

## 2023-01-10 DIAGNOSIS — N390 Urinary tract infection, site not specified: Secondary | ICD-10-CM | POA: Diagnosis not present

## 2023-01-10 DIAGNOSIS — D75838 Other thrombocytosis: Secondary | ICD-10-CM | POA: Diagnosis not present

## 2023-02-21 DIAGNOSIS — M546 Pain in thoracic spine: Secondary | ICD-10-CM | POA: Diagnosis not present

## 2023-02-21 DIAGNOSIS — M50122 Cervical disc disorder at C5-C6 level with radiculopathy: Secondary | ICD-10-CM | POA: Diagnosis not present

## 2023-02-21 DIAGNOSIS — M9902 Segmental and somatic dysfunction of thoracic region: Secondary | ICD-10-CM | POA: Diagnosis not present

## 2023-02-21 DIAGNOSIS — M9901 Segmental and somatic dysfunction of cervical region: Secondary | ICD-10-CM | POA: Diagnosis not present

## 2023-02-25 DIAGNOSIS — M50122 Cervical disc disorder at C5-C6 level with radiculopathy: Secondary | ICD-10-CM | POA: Diagnosis not present

## 2023-02-25 DIAGNOSIS — M9901 Segmental and somatic dysfunction of cervical region: Secondary | ICD-10-CM | POA: Diagnosis not present

## 2023-02-25 DIAGNOSIS — M546 Pain in thoracic spine: Secondary | ICD-10-CM | POA: Diagnosis not present

## 2023-02-25 DIAGNOSIS — M9902 Segmental and somatic dysfunction of thoracic region: Secondary | ICD-10-CM | POA: Diagnosis not present

## 2023-03-01 DIAGNOSIS — M546 Pain in thoracic spine: Secondary | ICD-10-CM | POA: Diagnosis not present

## 2023-03-01 DIAGNOSIS — M9902 Segmental and somatic dysfunction of thoracic region: Secondary | ICD-10-CM | POA: Diagnosis not present

## 2023-03-01 DIAGNOSIS — M9901 Segmental and somatic dysfunction of cervical region: Secondary | ICD-10-CM | POA: Diagnosis not present

## 2023-03-01 DIAGNOSIS — M50122 Cervical disc disorder at C5-C6 level with radiculopathy: Secondary | ICD-10-CM | POA: Diagnosis not present

## 2023-03-12 DIAGNOSIS — M546 Pain in thoracic spine: Secondary | ICD-10-CM | POA: Diagnosis not present

## 2023-03-12 DIAGNOSIS — M9902 Segmental and somatic dysfunction of thoracic region: Secondary | ICD-10-CM | POA: Diagnosis not present

## 2023-03-12 DIAGNOSIS — M9901 Segmental and somatic dysfunction of cervical region: Secondary | ICD-10-CM | POA: Diagnosis not present

## 2023-03-12 DIAGNOSIS — M50122 Cervical disc disorder at C5-C6 level with radiculopathy: Secondary | ICD-10-CM | POA: Diagnosis not present

## 2023-03-13 DIAGNOSIS — M546 Pain in thoracic spine: Secondary | ICD-10-CM | POA: Diagnosis not present

## 2023-03-13 DIAGNOSIS — M9902 Segmental and somatic dysfunction of thoracic region: Secondary | ICD-10-CM | POA: Diagnosis not present

## 2023-03-13 DIAGNOSIS — M50122 Cervical disc disorder at C5-C6 level with radiculopathy: Secondary | ICD-10-CM | POA: Diagnosis not present

## 2023-03-13 DIAGNOSIS — M9901 Segmental and somatic dysfunction of cervical region: Secondary | ICD-10-CM | POA: Diagnosis not present

## 2023-05-13 DIAGNOSIS — Z1231 Encounter for screening mammogram for malignant neoplasm of breast: Secondary | ICD-10-CM | POA: Diagnosis not present

## 2023-05-13 DIAGNOSIS — N399 Disorder of urinary system, unspecified: Secondary | ICD-10-CM | POA: Diagnosis not present

## 2023-05-13 DIAGNOSIS — Z01419 Encounter for gynecological examination (general) (routine) without abnormal findings: Secondary | ICD-10-CM | POA: Diagnosis not present

## 2023-05-13 DIAGNOSIS — M8588 Other specified disorders of bone density and structure, other site: Secondary | ICD-10-CM | POA: Diagnosis not present

## 2023-05-13 DIAGNOSIS — Z6833 Body mass index (BMI) 33.0-33.9, adult: Secondary | ICD-10-CM | POA: Diagnosis not present

## 2023-06-11 DIAGNOSIS — Z008 Encounter for other general examination: Secondary | ICD-10-CM | POA: Diagnosis not present

## 2023-06-11 DIAGNOSIS — B372 Candidiasis of skin and nail: Secondary | ICD-10-CM | POA: Diagnosis not present

## 2023-06-11 DIAGNOSIS — R300 Dysuria: Secondary | ICD-10-CM | POA: Diagnosis not present

## 2023-06-11 DIAGNOSIS — R739 Hyperglycemia, unspecified: Secondary | ICD-10-CM | POA: Diagnosis not present

## 2023-06-28 DIAGNOSIS — M50122 Cervical disc disorder at C5-C6 level with radiculopathy: Secondary | ICD-10-CM | POA: Diagnosis not present

## 2023-06-28 DIAGNOSIS — M9901 Segmental and somatic dysfunction of cervical region: Secondary | ICD-10-CM | POA: Diagnosis not present

## 2023-06-28 DIAGNOSIS — M546 Pain in thoracic spine: Secondary | ICD-10-CM | POA: Diagnosis not present

## 2023-06-28 DIAGNOSIS — M9902 Segmental and somatic dysfunction of thoracic region: Secondary | ICD-10-CM | POA: Diagnosis not present

## 2023-07-01 DIAGNOSIS — M546 Pain in thoracic spine: Secondary | ICD-10-CM | POA: Diagnosis not present

## 2023-07-01 DIAGNOSIS — M9901 Segmental and somatic dysfunction of cervical region: Secondary | ICD-10-CM | POA: Diagnosis not present

## 2023-07-01 DIAGNOSIS — M50122 Cervical disc disorder at C5-C6 level with radiculopathy: Secondary | ICD-10-CM | POA: Diagnosis not present

## 2023-07-01 DIAGNOSIS — M9902 Segmental and somatic dysfunction of thoracic region: Secondary | ICD-10-CM | POA: Diagnosis not present

## 2023-07-05 DIAGNOSIS — R300 Dysuria: Secondary | ICD-10-CM | POA: Diagnosis not present

## 2023-07-05 DIAGNOSIS — R739 Hyperglycemia, unspecified: Secondary | ICD-10-CM | POA: Diagnosis not present

## 2023-07-05 DIAGNOSIS — R002 Palpitations: Secondary | ICD-10-CM | POA: Diagnosis not present

## 2023-07-11 DIAGNOSIS — J309 Allergic rhinitis, unspecified: Secondary | ICD-10-CM | POA: Diagnosis not present

## 2023-09-03 DIAGNOSIS — D235 Other benign neoplasm of skin of trunk: Secondary | ICD-10-CM | POA: Diagnosis not present

## 2023-09-03 DIAGNOSIS — L579 Skin changes due to chronic exposure to nonionizing radiation, unspecified: Secondary | ICD-10-CM | POA: Diagnosis not present

## 2023-09-03 DIAGNOSIS — C4441 Basal cell carcinoma of skin of scalp and neck: Secondary | ICD-10-CM | POA: Diagnosis not present

## 2023-09-03 DIAGNOSIS — D485 Neoplasm of uncertain behavior of skin: Secondary | ICD-10-CM | POA: Diagnosis not present

## 2023-09-03 DIAGNOSIS — D692 Other nonthrombocytopenic purpura: Secondary | ICD-10-CM | POA: Diagnosis not present

## 2023-09-03 DIAGNOSIS — L821 Other seborrheic keratosis: Secondary | ICD-10-CM | POA: Diagnosis not present

## 2023-09-03 DIAGNOSIS — L814 Other melanin hyperpigmentation: Secondary | ICD-10-CM | POA: Diagnosis not present

## 2023-09-03 DIAGNOSIS — D225 Melanocytic nevi of trunk: Secondary | ICD-10-CM | POA: Diagnosis not present

## 2023-09-03 DIAGNOSIS — B353 Tinea pedis: Secondary | ICD-10-CM | POA: Diagnosis not present

## 2023-09-24 DIAGNOSIS — C4441 Basal cell carcinoma of skin of scalp and neck: Secondary | ICD-10-CM | POA: Diagnosis not present

## 2023-11-08 DIAGNOSIS — N390 Urinary tract infection, site not specified: Secondary | ICD-10-CM | POA: Diagnosis not present

## 2023-11-08 DIAGNOSIS — R300 Dysuria: Secondary | ICD-10-CM | POA: Diagnosis not present

## 2023-11-26 DIAGNOSIS — H2513 Age-related nuclear cataract, bilateral: Secondary | ICD-10-CM | POA: Diagnosis not present

## 2023-11-26 DIAGNOSIS — H353131 Nonexudative age-related macular degeneration, bilateral, early dry stage: Secondary | ICD-10-CM | POA: Diagnosis not present

## 2023-11-26 DIAGNOSIS — H35443 Age-related reticular degeneration of retina, bilateral: Secondary | ICD-10-CM | POA: Diagnosis not present

## 2023-11-26 DIAGNOSIS — H524 Presbyopia: Secondary | ICD-10-CM | POA: Diagnosis not present

## 2023-12-10 DIAGNOSIS — M5459 Other low back pain: Secondary | ICD-10-CM | POA: Diagnosis not present

## 2023-12-10 DIAGNOSIS — M9903 Segmental and somatic dysfunction of lumbar region: Secondary | ICD-10-CM | POA: Diagnosis not present

## 2023-12-10 DIAGNOSIS — M9902 Segmental and somatic dysfunction of thoracic region: Secondary | ICD-10-CM | POA: Diagnosis not present

## 2023-12-10 DIAGNOSIS — M546 Pain in thoracic spine: Secondary | ICD-10-CM | POA: Diagnosis not present

## 2023-12-11 DIAGNOSIS — M9903 Segmental and somatic dysfunction of lumbar region: Secondary | ICD-10-CM | POA: Diagnosis not present

## 2023-12-11 DIAGNOSIS — M546 Pain in thoracic spine: Secondary | ICD-10-CM | POA: Diagnosis not present

## 2023-12-11 DIAGNOSIS — M9902 Segmental and somatic dysfunction of thoracic region: Secondary | ICD-10-CM | POA: Diagnosis not present

## 2023-12-11 DIAGNOSIS — M5459 Other low back pain: Secondary | ICD-10-CM | POA: Diagnosis not present

## 2023-12-13 DIAGNOSIS — M9902 Segmental and somatic dysfunction of thoracic region: Secondary | ICD-10-CM | POA: Diagnosis not present

## 2023-12-13 DIAGNOSIS — M546 Pain in thoracic spine: Secondary | ICD-10-CM | POA: Diagnosis not present

## 2023-12-13 DIAGNOSIS — M9903 Segmental and somatic dysfunction of lumbar region: Secondary | ICD-10-CM | POA: Diagnosis not present

## 2023-12-13 DIAGNOSIS — M5459 Other low back pain: Secondary | ICD-10-CM | POA: Diagnosis not present

## 2023-12-16 DIAGNOSIS — M9903 Segmental and somatic dysfunction of lumbar region: Secondary | ICD-10-CM | POA: Diagnosis not present

## 2023-12-16 DIAGNOSIS — M9902 Segmental and somatic dysfunction of thoracic region: Secondary | ICD-10-CM | POA: Diagnosis not present

## 2023-12-16 DIAGNOSIS — M546 Pain in thoracic spine: Secondary | ICD-10-CM | POA: Diagnosis not present

## 2023-12-16 DIAGNOSIS — M5459 Other low back pain: Secondary | ICD-10-CM | POA: Diagnosis not present

## 2023-12-20 DIAGNOSIS — M9903 Segmental and somatic dysfunction of lumbar region: Secondary | ICD-10-CM | POA: Diagnosis not present

## 2023-12-20 DIAGNOSIS — M5459 Other low back pain: Secondary | ICD-10-CM | POA: Diagnosis not present

## 2023-12-20 DIAGNOSIS — M9902 Segmental and somatic dysfunction of thoracic region: Secondary | ICD-10-CM | POA: Diagnosis not present

## 2023-12-20 DIAGNOSIS — M546 Pain in thoracic spine: Secondary | ICD-10-CM | POA: Diagnosis not present

## 2023-12-24 DIAGNOSIS — M9903 Segmental and somatic dysfunction of lumbar region: Secondary | ICD-10-CM | POA: Diagnosis not present

## 2023-12-24 DIAGNOSIS — M5459 Other low back pain: Secondary | ICD-10-CM | POA: Diagnosis not present

## 2023-12-24 DIAGNOSIS — M9902 Segmental and somatic dysfunction of thoracic region: Secondary | ICD-10-CM | POA: Diagnosis not present

## 2023-12-24 DIAGNOSIS — M546 Pain in thoracic spine: Secondary | ICD-10-CM | POA: Diagnosis not present

## 2023-12-31 DIAGNOSIS — M9902 Segmental and somatic dysfunction of thoracic region: Secondary | ICD-10-CM | POA: Diagnosis not present

## 2023-12-31 DIAGNOSIS — M546 Pain in thoracic spine: Secondary | ICD-10-CM | POA: Diagnosis not present

## 2023-12-31 DIAGNOSIS — M5459 Other low back pain: Secondary | ICD-10-CM | POA: Diagnosis not present

## 2023-12-31 DIAGNOSIS — M9903 Segmental and somatic dysfunction of lumbar region: Secondary | ICD-10-CM | POA: Diagnosis not present

## 2024-01-07 DIAGNOSIS — M546 Pain in thoracic spine: Secondary | ICD-10-CM | POA: Diagnosis not present

## 2024-01-07 DIAGNOSIS — M9902 Segmental and somatic dysfunction of thoracic region: Secondary | ICD-10-CM | POA: Diagnosis not present

## 2024-01-07 DIAGNOSIS — M9903 Segmental and somatic dysfunction of lumbar region: Secondary | ICD-10-CM | POA: Diagnosis not present

## 2024-01-07 DIAGNOSIS — M5459 Other low back pain: Secondary | ICD-10-CM | POA: Diagnosis not present

## 2024-01-14 DIAGNOSIS — M5459 Other low back pain: Secondary | ICD-10-CM | POA: Diagnosis not present

## 2024-01-14 DIAGNOSIS — M9902 Segmental and somatic dysfunction of thoracic region: Secondary | ICD-10-CM | POA: Diagnosis not present

## 2024-01-14 DIAGNOSIS — M9903 Segmental and somatic dysfunction of lumbar region: Secondary | ICD-10-CM | POA: Diagnosis not present

## 2024-01-14 DIAGNOSIS — M546 Pain in thoracic spine: Secondary | ICD-10-CM | POA: Diagnosis not present

## 2024-01-22 DIAGNOSIS — M546 Pain in thoracic spine: Secondary | ICD-10-CM | POA: Diagnosis not present

## 2024-01-22 DIAGNOSIS — M5459 Other low back pain: Secondary | ICD-10-CM | POA: Diagnosis not present

## 2024-01-22 DIAGNOSIS — M9902 Segmental and somatic dysfunction of thoracic region: Secondary | ICD-10-CM | POA: Diagnosis not present

## 2024-01-22 DIAGNOSIS — M9903 Segmental and somatic dysfunction of lumbar region: Secondary | ICD-10-CM | POA: Diagnosis not present

## 2024-02-04 DIAGNOSIS — M9902 Segmental and somatic dysfunction of thoracic region: Secondary | ICD-10-CM | POA: Diagnosis not present

## 2024-02-04 DIAGNOSIS — M5459 Other low back pain: Secondary | ICD-10-CM | POA: Diagnosis not present

## 2024-02-04 DIAGNOSIS — M9903 Segmental and somatic dysfunction of lumbar region: Secondary | ICD-10-CM | POA: Diagnosis not present

## 2024-02-04 DIAGNOSIS — M546 Pain in thoracic spine: Secondary | ICD-10-CM | POA: Diagnosis not present

## 2024-02-18 DIAGNOSIS — L570 Actinic keratosis: Secondary | ICD-10-CM | POA: Diagnosis not present

## 2024-02-18 DIAGNOSIS — D692 Other nonthrombocytopenic purpura: Secondary | ICD-10-CM | POA: Diagnosis not present

## 2024-02-18 DIAGNOSIS — Z85828 Personal history of other malignant neoplasm of skin: Secondary | ICD-10-CM | POA: Diagnosis not present

## 2024-02-18 DIAGNOSIS — L821 Other seborrheic keratosis: Secondary | ICD-10-CM | POA: Diagnosis not present

## 2024-03-31 DIAGNOSIS — S61432A Puncture wound without foreign body of left hand, initial encounter: Secondary | ICD-10-CM | POA: Diagnosis not present

## 2024-04-21 DIAGNOSIS — E860 Dehydration: Secondary | ICD-10-CM | POA: Diagnosis not present

## 2024-04-21 DIAGNOSIS — R300 Dysuria: Secondary | ICD-10-CM | POA: Diagnosis not present

## 2024-04-21 DIAGNOSIS — N390 Urinary tract infection, site not specified: Secondary | ICD-10-CM | POA: Diagnosis not present

## 2024-04-30 DIAGNOSIS — E860 Dehydration: Secondary | ICD-10-CM | POA: Diagnosis not present

## 2024-04-30 DIAGNOSIS — R300 Dysuria: Secondary | ICD-10-CM | POA: Diagnosis not present

## 2024-05-13 DIAGNOSIS — Z6833 Body mass index (BMI) 33.0-33.9, adult: Secondary | ICD-10-CM | POA: Diagnosis not present

## 2024-05-13 DIAGNOSIS — Z1272 Encounter for screening for malignant neoplasm of vagina: Secondary | ICD-10-CM | POA: Diagnosis not present

## 2024-05-13 DIAGNOSIS — Z01419 Encounter for gynecological examination (general) (routine) without abnormal findings: Secondary | ICD-10-CM | POA: Diagnosis not present

## 2024-05-13 DIAGNOSIS — Z1231 Encounter for screening mammogram for malignant neoplasm of breast: Secondary | ICD-10-CM | POA: Diagnosis not present

## 2024-05-21 DIAGNOSIS — E860 Dehydration: Secondary | ICD-10-CM | POA: Diagnosis not present

## 2024-05-21 DIAGNOSIS — N390 Urinary tract infection, site not specified: Secondary | ICD-10-CM | POA: Diagnosis not present

## 2024-05-21 DIAGNOSIS — R300 Dysuria: Secondary | ICD-10-CM | POA: Diagnosis not present

## 2024-07-25 DIAGNOSIS — R030 Elevated blood-pressure reading, without diagnosis of hypertension: Secondary | ICD-10-CM | POA: Diagnosis not present

## 2024-07-25 DIAGNOSIS — J309 Allergic rhinitis, unspecified: Secondary | ICD-10-CM | POA: Diagnosis not present

## 2024-07-27 DIAGNOSIS — M6281 Muscle weakness (generalized): Secondary | ICD-10-CM | POA: Diagnosis not present

## 2024-07-27 DIAGNOSIS — I10 Essential (primary) hypertension: Secondary | ICD-10-CM | POA: Diagnosis not present

## 2024-07-27 DIAGNOSIS — R739 Hyperglycemia, unspecified: Secondary | ICD-10-CM | POA: Diagnosis not present

## 2024-08-04 DIAGNOSIS — M5459 Other low back pain: Secondary | ICD-10-CM | POA: Diagnosis not present

## 2024-08-04 DIAGNOSIS — M9902 Segmental and somatic dysfunction of thoracic region: Secondary | ICD-10-CM | POA: Diagnosis not present

## 2024-08-04 DIAGNOSIS — M9903 Segmental and somatic dysfunction of lumbar region: Secondary | ICD-10-CM | POA: Diagnosis not present

## 2024-08-04 DIAGNOSIS — M546 Pain in thoracic spine: Secondary | ICD-10-CM | POA: Diagnosis not present

## 2024-08-05 DIAGNOSIS — M5459 Other low back pain: Secondary | ICD-10-CM | POA: Diagnosis not present

## 2024-08-05 DIAGNOSIS — M9903 Segmental and somatic dysfunction of lumbar region: Secondary | ICD-10-CM | POA: Diagnosis not present

## 2024-08-05 DIAGNOSIS — M546 Pain in thoracic spine: Secondary | ICD-10-CM | POA: Diagnosis not present

## 2024-08-05 DIAGNOSIS — M9902 Segmental and somatic dysfunction of thoracic region: Secondary | ICD-10-CM | POA: Diagnosis not present

## 2024-08-06 DIAGNOSIS — M9902 Segmental and somatic dysfunction of thoracic region: Secondary | ICD-10-CM | POA: Diagnosis not present

## 2024-08-06 DIAGNOSIS — M9903 Segmental and somatic dysfunction of lumbar region: Secondary | ICD-10-CM | POA: Diagnosis not present

## 2024-08-06 DIAGNOSIS — M5459 Other low back pain: Secondary | ICD-10-CM | POA: Diagnosis not present

## 2024-08-06 DIAGNOSIS — M546 Pain in thoracic spine: Secondary | ICD-10-CM | POA: Diagnosis not present

## 2024-08-10 DIAGNOSIS — I10 Essential (primary) hypertension: Secondary | ICD-10-CM | POA: Diagnosis not present

## 2024-08-10 DIAGNOSIS — E860 Dehydration: Secondary | ICD-10-CM | POA: Diagnosis not present

## 2024-08-11 DIAGNOSIS — M546 Pain in thoracic spine: Secondary | ICD-10-CM | POA: Diagnosis not present

## 2024-08-11 DIAGNOSIS — M5459 Other low back pain: Secondary | ICD-10-CM | POA: Diagnosis not present

## 2024-08-11 DIAGNOSIS — M9902 Segmental and somatic dysfunction of thoracic region: Secondary | ICD-10-CM | POA: Diagnosis not present

## 2024-08-11 DIAGNOSIS — M9903 Segmental and somatic dysfunction of lumbar region: Secondary | ICD-10-CM | POA: Diagnosis not present

## 2024-08-13 DIAGNOSIS — M9902 Segmental and somatic dysfunction of thoracic region: Secondary | ICD-10-CM | POA: Diagnosis not present

## 2024-08-13 DIAGNOSIS — M5459 Other low back pain: Secondary | ICD-10-CM | POA: Diagnosis not present

## 2024-08-13 DIAGNOSIS — M9903 Segmental and somatic dysfunction of lumbar region: Secondary | ICD-10-CM | POA: Diagnosis not present

## 2024-08-13 DIAGNOSIS — M546 Pain in thoracic spine: Secondary | ICD-10-CM | POA: Diagnosis not present

## 2024-08-17 DIAGNOSIS — M9902 Segmental and somatic dysfunction of thoracic region: Secondary | ICD-10-CM | POA: Diagnosis not present

## 2024-08-17 DIAGNOSIS — M9903 Segmental and somatic dysfunction of lumbar region: Secondary | ICD-10-CM | POA: Diagnosis not present

## 2024-08-17 DIAGNOSIS — M546 Pain in thoracic spine: Secondary | ICD-10-CM | POA: Diagnosis not present

## 2024-08-17 DIAGNOSIS — M5459 Other low back pain: Secondary | ICD-10-CM | POA: Diagnosis not present

## 2024-08-19 DIAGNOSIS — M9903 Segmental and somatic dysfunction of lumbar region: Secondary | ICD-10-CM | POA: Diagnosis not present

## 2024-08-19 DIAGNOSIS — M5459 Other low back pain: Secondary | ICD-10-CM | POA: Diagnosis not present

## 2024-08-19 DIAGNOSIS — M9902 Segmental and somatic dysfunction of thoracic region: Secondary | ICD-10-CM | POA: Diagnosis not present

## 2024-08-19 DIAGNOSIS — M546 Pain in thoracic spine: Secondary | ICD-10-CM | POA: Diagnosis not present

## 2024-08-24 DIAGNOSIS — M549 Dorsalgia, unspecified: Secondary | ICD-10-CM | POA: Diagnosis not present
